# Patient Record
Sex: Male | Born: 1960 | Race: White | Hispanic: No | Marital: Married | Smoking: Never smoker
Health system: Southern US, Community
[De-identification: ages and names within clinical notes are randomized; demographics above are authoritative.]

## PROBLEM LIST (undated history)

## (undated) DIAGNOSIS — M25561 Pain in right knee: Secondary | ICD-10-CM

## (undated) DIAGNOSIS — Z9889 Other specified postprocedural states: Secondary | ICD-10-CM

## (undated) DIAGNOSIS — I480 Paroxysmal atrial fibrillation: Principal | ICD-10-CM

## (undated) DIAGNOSIS — E669 Obesity, unspecified: Secondary | ICD-10-CM

## (undated) DIAGNOSIS — L0390 Cellulitis, unspecified: Secondary | ICD-10-CM

## (undated) DIAGNOSIS — I10 Essential (primary) hypertension: Secondary | ICD-10-CM

## (undated) DIAGNOSIS — M25562 Pain in left knee: Secondary | ICD-10-CM

## (undated) HISTORY — DX: Obesity, unspecified: E66.9

## (undated) HISTORY — DX: Pain in left knee: M25.562

## (undated) HISTORY — DX: Paroxysmal atrial fibrillation: I48.0

## (undated) HISTORY — DX: Other specified postprocedural states: Z98.890

## (undated) HISTORY — DX: Essential (primary) hypertension: I10

## (undated) HISTORY — DX: Pain in right knee: M25.561

## (undated) HISTORY — DX: Cellulitis, unspecified: L03.90

---

## 2004-10-04 ENCOUNTER — Ambulatory Visit: Payer: Self-pay | Admitting: Professional

## 2004-10-11 ENCOUNTER — Ambulatory Visit: Payer: Self-pay | Admitting: Professional

## 2004-10-18 ENCOUNTER — Ambulatory Visit: Payer: Self-pay | Admitting: Professional

## 2005-08-12 ENCOUNTER — Emergency Department: Payer: Self-pay | Admitting: Unknown Physician Specialty

## 2007-05-06 DIAGNOSIS — I10 Essential (primary) hypertension: Secondary | ICD-10-CM | POA: Insufficient documentation

## 2007-05-16 ENCOUNTER — Ambulatory Visit: Payer: Self-pay | Admitting: Family Medicine

## 2007-05-16 DIAGNOSIS — E7800 Pure hypercholesterolemia, unspecified: Secondary | ICD-10-CM

## 2007-05-16 LAB — CONVERTED CEMR LAB
Alkaline Phosphatase: 63 units/L (ref 39–117)
BUN: 6 mg/dL (ref 6–23)
CO2: 29 meq/L (ref 19–32)
Calcium: 9.1 mg/dL (ref 8.4–10.5)
GFR calc Af Amer: 133 mL/min
GFR calc non Af Amer: 110 mL/min
LDL Cholesterol: 92 mg/dL (ref 0–99)
Potassium: 4.4 meq/L (ref 3.5–5.1)
Total CHOL/HDL Ratio: 4.6
Total Protein: 6.8 g/dL (ref 6.0–8.3)
Triglycerides: 82 mg/dL (ref 0–149)
VLDL: 16 mg/dL (ref 0–40)

## 2007-05-20 ENCOUNTER — Ambulatory Visit: Payer: Self-pay | Admitting: Family Medicine

## 2007-05-31 ENCOUNTER — Ambulatory Visit: Payer: Self-pay | Admitting: Cardiology

## 2007-06-16 ENCOUNTER — Emergency Department: Payer: Self-pay | Admitting: Emergency Medicine

## 2007-07-01 ENCOUNTER — Ambulatory Visit: Payer: Self-pay | Admitting: Family Medicine

## 2007-07-01 DIAGNOSIS — M25569 Pain in unspecified knee: Secondary | ICD-10-CM | POA: Insufficient documentation

## 2007-08-14 ENCOUNTER — Ambulatory Visit: Payer: Self-pay | Admitting: Family Medicine

## 2007-10-21 ENCOUNTER — Telehealth (INDEPENDENT_AMBULATORY_CARE_PROVIDER_SITE_OTHER): Payer: Self-pay | Admitting: *Deleted

## 2007-10-22 ENCOUNTER — Ambulatory Visit: Payer: Self-pay | Admitting: Family Medicine

## 2007-11-15 ENCOUNTER — Ambulatory Visit: Payer: Self-pay | Admitting: Family Medicine

## 2008-05-19 ENCOUNTER — Ambulatory Visit: Payer: Self-pay | Admitting: Family Medicine

## 2008-05-19 LAB — CONVERTED CEMR LAB
ALT: 17 units/L (ref 0–53)
AST: 15 units/L (ref 0–37)
Albumin: 3.6 g/dL (ref 3.5–5.2)
BUN: 16 mg/dL (ref 6–23)
CO2: 28 meq/L (ref 19–32)
Calcium: 9.1 mg/dL (ref 8.4–10.5)
Chloride: 108 meq/L (ref 96–112)
Cholesterol: 129 mg/dL (ref 0–200)
Creatinine, Ser: 1 mg/dL (ref 0.4–1.5)
GFR calc non Af Amer: 84 mL/min
HDL: 32.2 mg/dL — ABNORMAL LOW (ref >39.0)
LDL Cholesterol: 83 mg/dL (ref 0–99)
PSA: 1.39 ng/mL (ref 0.10–4.00)
Triglycerides: 71 mg/dL (ref 0–149)

## 2008-05-21 ENCOUNTER — Ambulatory Visit: Payer: Self-pay | Admitting: Family Medicine

## 2009-04-22 ENCOUNTER — Ambulatory Visit: Payer: Self-pay | Admitting: Family Medicine

## 2009-04-22 DIAGNOSIS — M109 Gout, unspecified: Secondary | ICD-10-CM

## 2009-05-25 ENCOUNTER — Encounter (INDEPENDENT_AMBULATORY_CARE_PROVIDER_SITE_OTHER): Payer: Self-pay | Admitting: *Deleted

## 2009-06-03 ENCOUNTER — Ambulatory Visit: Payer: Self-pay | Admitting: Family Medicine

## 2009-06-03 LAB — CONVERTED CEMR LAB
Albumin: 3.6 g/dL (ref 3.5–5.2)
Alkaline Phosphatase: 51 units/L (ref 39–117)
Basophils Relative: 0.3 % (ref 0.0–3.0)
CO2: 30 meq/L (ref 19–32)
Chloride: 108 meq/L (ref 96–112)
Eosinophils Absolute: 0.1 10*3/uL (ref 0.0–0.7)
HCT: 42.3 % (ref 39.0–52.0)
Hemoglobin: 13.9 g/dL (ref 13.0–17.0)
Lymphocytes Relative: 44.2 % (ref 12.0–46.0)
MCHC: 32.9 g/dL (ref 30.0–36.0)
MCV: 86.8 fL (ref 78.0–100.0)
Monocytes Absolute: 0.5 10*3/uL (ref 0.1–1.0)
Neutro Abs: 3.2 10*3/uL (ref 1.4–7.7)
PSA: 1.09 ng/mL (ref 0.10–4.00)
RBC: 4.87 M/uL (ref 4.22–5.81)
Sodium: 143 meq/L (ref 135–145)
Total CHOL/HDL Ratio: 4
Total Protein: 6.9 g/dL (ref 6.0–8.3)

## 2009-06-09 ENCOUNTER — Ambulatory Visit: Payer: Self-pay | Admitting: Family Medicine

## 2009-06-17 ENCOUNTER — Encounter (INDEPENDENT_AMBULATORY_CARE_PROVIDER_SITE_OTHER): Payer: Self-pay | Admitting: *Deleted

## 2009-06-17 ENCOUNTER — Ambulatory Visit: Payer: Self-pay | Admitting: Family Medicine

## 2009-09-08 IMAGING — CR DG KNEE COMPLETE 4+V*L*
1 series · 5 of 5 positions shown · non-contrast
Comparison: none

REASON FOR EXAM: Injury
COMMENTS:

PROCEDURE:     DXR - DXR KNEE LT COMP WITH OBLIQUES  - June 16, 2007  [DATE]
RESULT:     Images of the LEFT knee demonstrate no fracture, dislocation or
radiopaque foreign body. Degenerative joint space narrowing is noted in the
patellofemoral region.

[Series 1: view not recorded · 0.17mm/px · 5 of 5 slices shown]
[im 1/5]
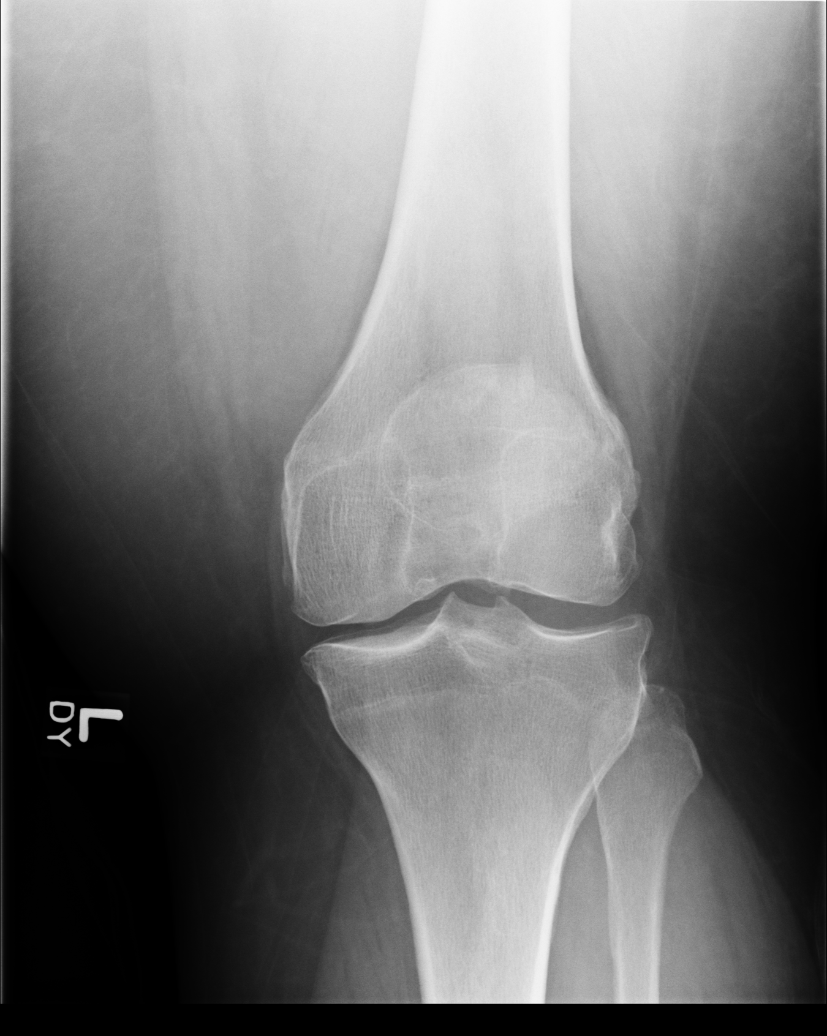
[im 2/5]
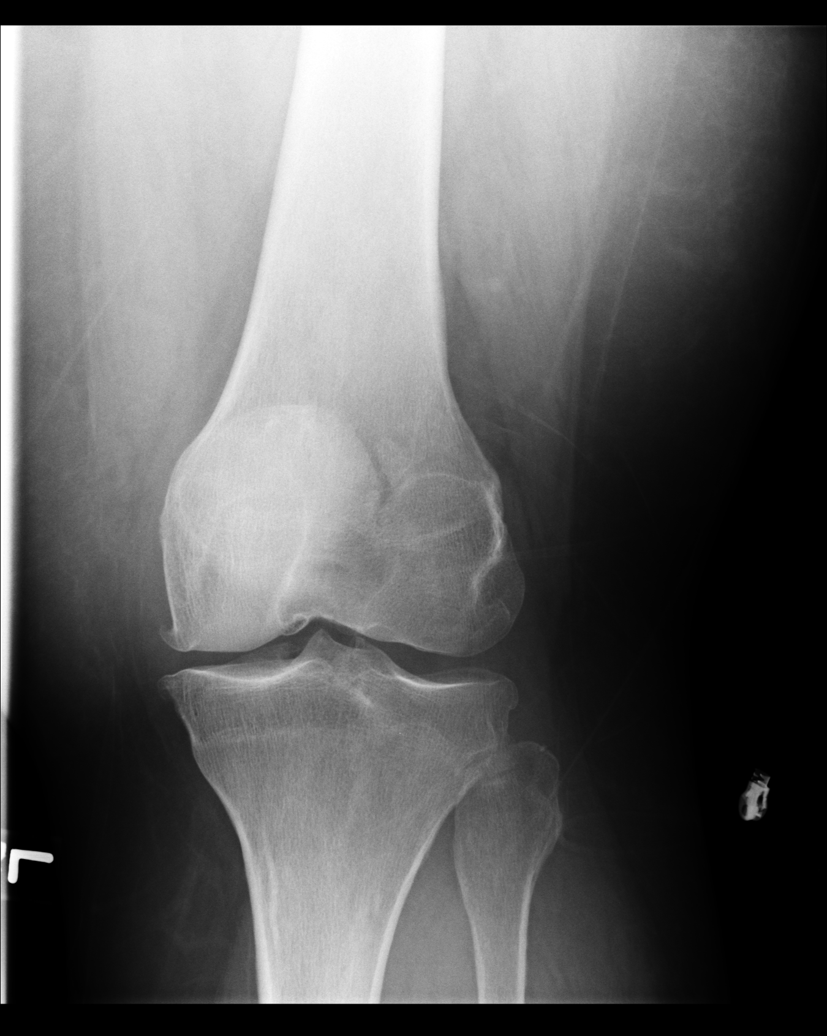
[im 3/5]
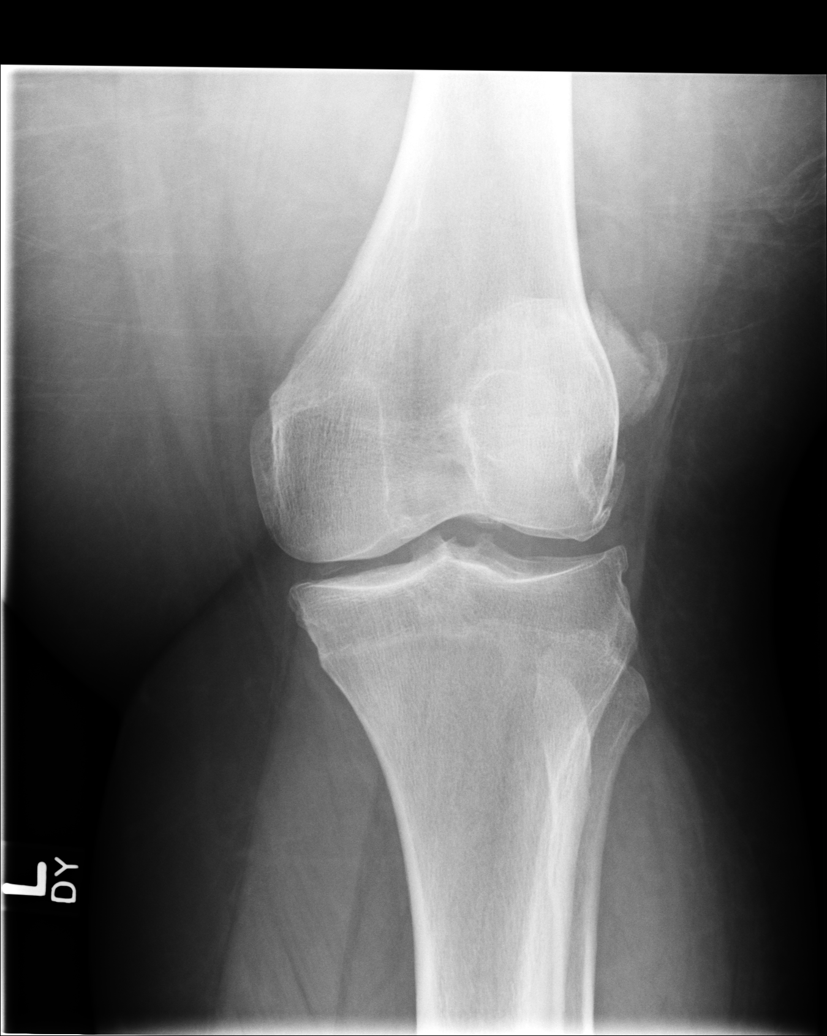
[im 4/5]
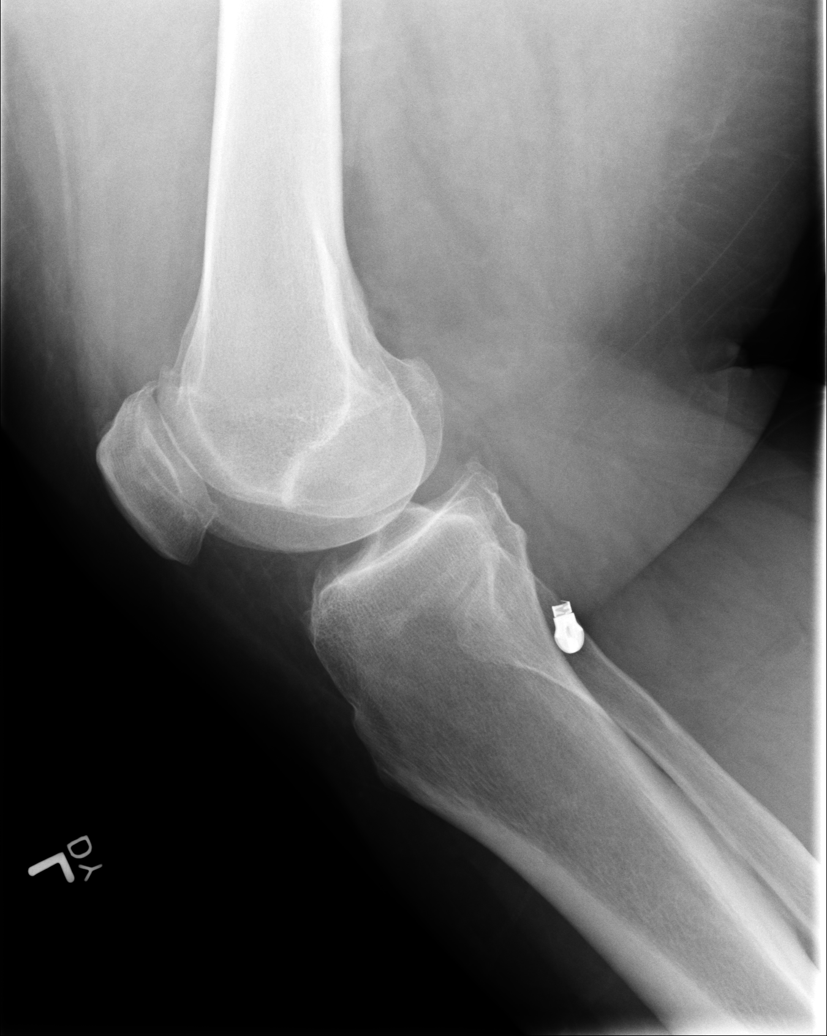
[im 5/5]
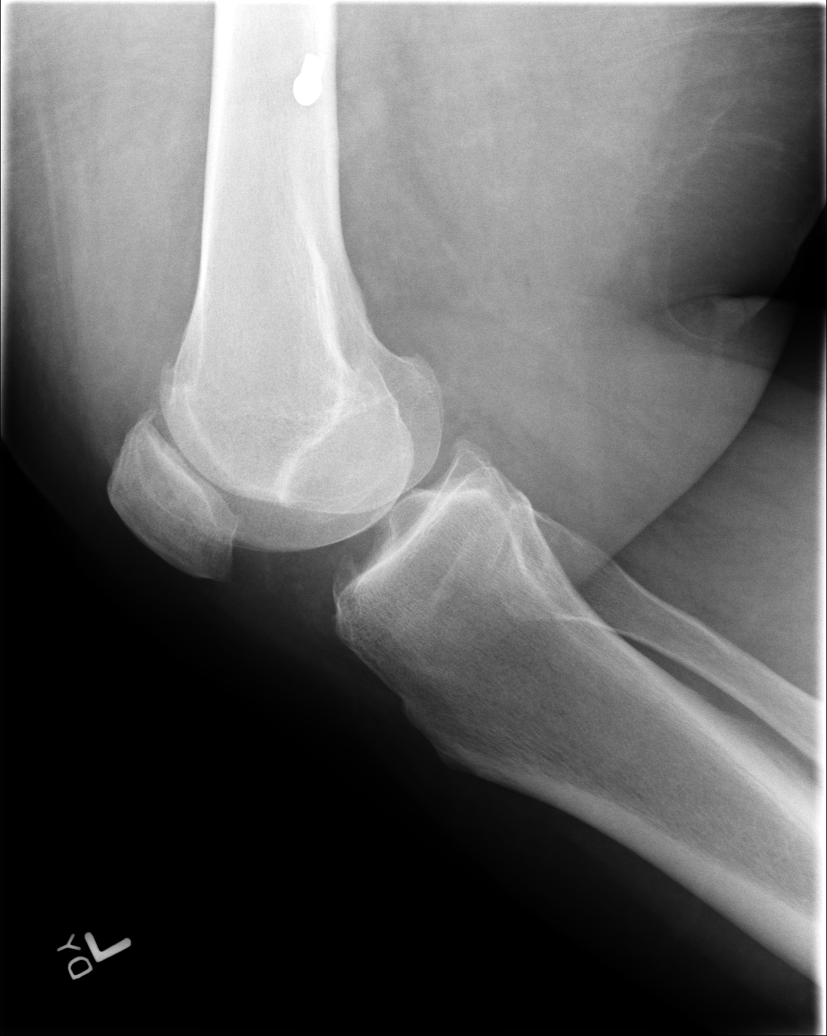

[5 of 5 positions shown; findings below may reference images not displayed]

IMPRESSION: Degenerative changes with joint space narrowing and
hypertrophic spurring. No acute bony abnormality is demonstrated.

## 2009-12-13 ENCOUNTER — Ambulatory Visit: Payer: Self-pay | Admitting: Family Medicine

## 2010-04-13 ENCOUNTER — Encounter (INDEPENDENT_AMBULATORY_CARE_PROVIDER_SITE_OTHER): Payer: Self-pay | Admitting: *Deleted

## 2010-10-11 NOTE — Letter (Signed)
Summary: Nadara Eaton letter  [place] [facility]  [address] [place], [place] [address]   Phone: [phone]  Fax: [phone]       04/13/2010 MRN: [id number]  [place] [address] [place], [place]  [address]  Dear Mr. Topor,  [facility] [place], and [place] announce the retirement of Arta Silence, M.D., from full-time practice at the [facility] office effective March 10, 2010 and his plans of returning part-time.  It is important to Dr. Hetty Ely and to our practice that you understand that [facility] has seven physicians in our office for your health care needs.  We will continue to offer the same exceptional care that you have today.    Dr. Hetty Ely has spoken to many of you about his plans for retirement and returning part-time in the fall.   We will continue to work with you through the transition to schedule appointments for you in the office and meet the high standards that [facility] is committed to.   Again, it is with great pleasure that we share the news that Dr. Hetty Ely will return to [facility] in October of 2011 with a reduced schedule.    If you have any questions, or would like to request an appointment with one of our physicians, please call us at [phone] and press the option for Scheduling an appointment.  We take pleasure in providing you with excellent patient care and look forward to seeing you at your next office visit.  Our [facility] Physicians are:  Tillman Abide, M.D. Laurita Quint, M.D. Roxy Manns, M.D. Kerby Nora, M.D. Hannah Beat, M.D. Ruthe Mannan, M.D. We proudly welcomed Raechel Ache, M.D. and Eustaquio Boyden, M.D. to the practice in July/August 2011.  Sincerely,  [facility]

## 2010-10-11 NOTE — Assessment & Plan Note (Signed)
Summary: 6 MONTH FOLLOW UP/[facility]   Vital Signs:  Patient profile:   50 year old male Weight:      423 pounds Temp:     98.7 degrees F oral Pulse rate:   60 / minute Pulse rhythm:   regular BP sitting:   128 / 84  (left arm) Cuff size:   large  Vitals Entered By: Sydell Axon LPN (December 14, 8411 9:54 AM) CC: 6 Month follow-up   History of Present Illness: Pt here for 6 month followup. He has gained 40+ pounds since last time. The holidays "got away from him" and he has had trouble with his ankles or feet enought that it has episodically bothered his workout routine.  He has no complaints. His right shoulder feels strained. He thought he had done something to his right shoulder lifting weights so backed off with no improvement. He declines meds or referral.  Problems Prior to Update: 1)  Special Screening Malig Neoplasms Other Sites  (ICD-V76.49) 2)  Gout  (ICD-274.9) 3)  Special Screening Malignant Neoplasm of Prostate  (ICD-V76.44) 4)  Knee Pain  (ICD-719.46) 5)  Health Maintenance Exam  (ICD-V70.0) 6)  Hypercholesterolemia, Pure  (ICD-272.0) 7)  Obesity  (ICD-278.00) 8)  Hypertension  (ICD-401.9)  Medications Prior to Update: 1)  Multivitamins   Tabs (Multiple Vitamin) .Marland Kitchen.. 1 Daily By Mouth 2)  Fish Oil Concentrate 1000 Mg  Caps (Omega-3 Fatty Acids) .Marland Kitchen.. 1 Tablet Twice A Day By Mouth 3)  Metoprolol Tartrate 50 Mg  Tabs (Metoprolol Tartrate) .... One Tab By Mouth Twice A Day 4)  Ibuprofen 200 Mg Tabs (Ibuprofen) .... Otc As Directed. 5)  Indocin 50mg  .... Take 1 Three Times A Day For 5d, Then 1 Two Times A Day For 5-7d, Then As Needed 6)  Nystatin 100000 Unit/gm Crea (Nystatin) .... Apply To Skin Two Times A Day  Allergies: No Known Drug Allergies  Physical Exam  General:  alert, well-developed, well-nourished, and well-hydrated.  morbidly obese, no acute distress. Head:  Normocephalic and atraumatic without obvious abnormalities. No apparent alopecia or balding. Sinuses  NT. Eyes:  Conjunctiva clear bilaterally.  Ears:  External ear exam shows no significant lesions or deformities.  Otoscopic examination reveals clear canals, tympanic membranes are intact bilaterally without bulging, retraction, inflammation or discharge. Hearing is grossly normal bilaterally. Nose:  External nasal examination shows no deformity or inflammation. Nasal mucosa are pink and moist without lesions or exudates. Mouth:  Oral mucosa and oropharynx without lesions or exudates.  Teeth in good repair. Neck:  No deformities, masses, or tenderness noted. Lungs:  Normal respiratory effort, chest expands symmetrically. Lungs are clear to auscultation, no crackles or wheezes. Heart:  Normal rate and regular rhythm. S1 and S2 normal without gallop, murmur, click, rub or other extra sounds.   Impression & Recommendations:  Problem # 1:  OBESITY (ICD-278.00) Assessment Deteriorated Declines nutrition referral. Infers he will start exercising regularly and try to watch his intake. He has reead [employer] in the past.  Problem # 2:  HYPERTENSION (ICD-401.9) Assessment: Unchanged Stable. Cont curr regimen. His updated medication list for this problem includes:    Metoprolol Tartrate 50 Mg Tabs (Metoprolol tartrate) ..... One tab by mouth twice a day  BP today: 128/84 Prior BP: 122/74 (06/09/2009)  Labs Reviewed: K+: 4.7 (06/03/2009) Creat: : 0.9 (06/03/2009)   Chol: 142 (06/03/2009)   HDL: 37.90 (06/03/2009)   LDL: 89 (06/03/2009)   TG: 75.0 (06/03/2009)  Complete Medication List: 1)  Multivitamins Tabs (Multiple vitamin) .Marland Kitchen.. 1 daily by mouth 2)  Fish Oil Concentrate 1000 Mg Caps (Omega-3 fatty acids) .Marland Kitchen.. 1 tablet twice a day by mouth 3)  Metoprolol Tartrate 50 Mg Tabs (Metoprolol tartrate) .... One tab by mouth twice a day 4)  Ibuprofen 200 Mg Tabs (Ibuprofen) .... Otc as directed. 5)  Indocin 50mg   .... Take 1 three times a day for 5d, then 1 two times a day for 5-7d, then as  needed 6)  Nystatin 100000 Unit/gm Crea (Nystatin) .... Apply to skin two times a day  Patient Instructions: 1)  RTC in the Fall for Comp Exam, labs prior. Prescriptions: METOPROLOL TARTRATE 50 MG  TABS (METOPROLOL TARTRATE) one tab by mouth twice a day  #60 x 12   Entered by:   Sydell Axon LPN   Authorized by:   Shaune Leeks MD   Signed by:   Sydell Axon LPN on 45/40/9811   Method used:   Print then Give to Patient   RxID:   [id number]   Current Allergies (reviewed today): No known allergies

## 2010-12-23 ENCOUNTER — Encounter: Payer: Self-pay | Admitting: Family Medicine

## 2011-01-17 ENCOUNTER — Ambulatory Visit (INDEPENDENT_AMBULATORY_CARE_PROVIDER_SITE_OTHER): Payer: 59 | Admitting: Family Medicine

## 2011-01-17 ENCOUNTER — Encounter: Payer: Self-pay | Admitting: Family Medicine

## 2011-01-17 DIAGNOSIS — R635 Abnormal weight gain: Secondary | ICD-10-CM

## 2011-01-17 DIAGNOSIS — I10 Essential (primary) hypertension: Secondary | ICD-10-CM

## 2011-01-17 MED ORDER — METOPROLOL TARTRATE 50 MG PO TABS: 50.0000 mg | ORAL_TABLET | Freq: Two times a day (BID) | ORAL | Status: DC

## 2011-01-17 NOTE — Progress Notes (Signed)
Hypertension:    Using medication without problems or lightheadedness: yes Chest pain with exertion:no Edema:occ Short of breath:with exertion, no change recently Other issues: d/w pt about weight, diet and exercise.  He is getting back into exercising  Meds, vitals, and allergies reviewed.   PMH and SH reviewed  ROS: See HPI.  Otherwise negative.    GEN: nad, alert and oriented, obese HEENT: mucous membranes moist NECK: supple w/o LA CV: rrr. PULM: ctab, no inc wob ABD: soft, +bs, exam limited by habitus EXT: trace edema SKIN: no acute rash

## 2011-01-17 NOTE — Assessment & Plan Note (Signed)
Check TSH at CPE this fall.  Continue to work on diet/exercise.

## 2011-01-17 NOTE — Assessment & Plan Note (Addendum)
Continue meds, work on diet and exercise.  Recheck in the fall.  If he isn't losing weight, we can set him up with nutrition.  He wanted to try on his own over the summer.  CPE this fall.

## 2011-01-17 NOTE — Patient Instructions (Signed)
I sent your medicine to the pharmacy.  Work on gradually increasing your exercise routine.   Schedule a physical for late September with labs ahead of time.  Take care.  Glad to see you today.

## 2011-01-24 NOTE — Assessment & Plan Note (Signed)
[facility] OFFICE NOTE   NAME:Benjamin Patterson                      MRN:          [id number]  DATE:05/30/2007                            DOB:          Jan 11, 1961    [name] is a delightful, 50 year old, married, white male with  multiple cardiac risk factors who Dr. Hetty Ely has asked to consult on  cardiovascular risk assessment and question need for stress test.   He is 50 years of age and has followed his weight for years.  He does  walk 1-2 miles 5-6 days a week.  He has some mild dyspnea but this has  not changed in a long time.  He feels this is from his weight.  Fortunately, he does not smoke.  He does not drink alcohol.  He is not  diabetic.  He has recently been diagnosed with hypertension and was just started on  metoprolol by Dr. Hetty Ely.  Dr. Hetty Ely lists hyperlipidemia but the patient says his cholesterol  is pretty good.  I suspect his triglycerides are high.  He is on fish  oil.  He is having no chest tightness, pressure, or symptoms of ischemia at  the present time.  He has no orthopnea, PND, or peripheral edema.   PAST MEDICAL HISTORY:  He has no known drug allergies, no dye allergies.   CURRENT MEDICATIONS:  1. Metoprolol XL 50 mg a day.  2. Fish oil 2,000 mg in the morning and 1,000 mg at noon.   SURGICAL HISTORY:  None.   FAMILY HISTORY:  His father had his 1st heart attack in his late 63s.   SOCIAL HISTORY:  He is a Advertising account planner.  He does a lot of computer  work.  He is married and has one 58 year old child.   REVIEW OF SYSTEMS:  Other than the HPI, he has a history of constipation  and reflux and some gout.  Otherwise, negative.   PHYSICAL EXAMINATION:  VITAL SIGNS:  His blood pressure is 150/90; his  pulse is 50 and regular; his weight is 405; he is 5 feet 9-3/4 inches.  HEENT:  Normocephalic atraumatic.  PERRLA.  Extraocular movements  intact.  Sclerae are  clear.  Facial symmetry is normal.  NECK:  Carotids  upstrokes are equal bilaterally without bruits.  There is no JVD.  Neck  is supple.  Thyroid is not enlarged.  LUNGS:  Clear.  HEART:  PMI could not be appreciated.  Normal S1 S2.  There is no  murmur.  ABDOMEN:  Obese.  Organomegaly cannot be assessed.  EXTREMITIES:  Reveal trace edema.  Pulses are intact.  NEUROLOGIC:  Intact.  SKIN:  Unremarkable.   His electrocardiogram is normal except for sinus bradycardia.   ASSESSMENT/PLAN:  I have had a long talk with [name].  I made it  clear to him that with his weight and his hypertension that he probably  has some atherosclerosis at this point.  Clinically he is not having any  major issues.  I do not think a stress test will really help Benjamin Patterson  at this  point in time.   I have strongly encouraged him to get his weight down and keep close  watch on his blood pressure.  Dr. Hetty Ely may have to add a second  drug.  Even though he is bradycardic, he is having no symptoms from  that.   If he begins to have increased dyspnea on exertion or any chest pressure  or discomfort with exertion, I have asked him to reconsult with Benjamin Patterson.  At  that point, we would consider a stress test and/or angiography.   He also realizes the risk of developing diabetes and obesity.  We spent  about 10 minutes talking about this as well.   We will see him back on a p.r.n. basis.   His weight may also preclude using a standard treadmill as a stress  test.     Maisie Fus C. Daleen Squibb, MD, [facility]  Electronically Signed    TCW/MedQ  DD: 05/31/2007  DT: 05/31/2007  Job #: [id number]   cc:   Arta Silence, MD

## 2011-02-28 ENCOUNTER — Encounter: Payer: Self-pay | Admitting: Cardiovascular Disease

## 2011-07-10 ENCOUNTER — Other Ambulatory Visit: Payer: 59

## 2011-07-17 ENCOUNTER — Encounter: Payer: 59 | Admitting: Family Medicine

## 2011-08-02 ENCOUNTER — Emergency Department (HOSPITAL_COMMUNITY): Payer: 59

## 2011-08-02 ENCOUNTER — Inpatient Hospital Stay (HOSPITAL_COMMUNITY)
Admission: EM | Admit: 2011-08-02 | Discharge: 2011-08-05 | DRG: 287 | Disposition: A | Discharge status: Home / Self Care | Payer: 59 | Attending: Cardiology | Admitting: Cardiology

## 2011-08-02 ENCOUNTER — Encounter (HOSPITAL_COMMUNITY): Payer: Self-pay | Admitting: Emergency Medicine

## 2011-08-02 ENCOUNTER — Other Ambulatory Visit: Payer: Self-pay

## 2011-08-02 DIAGNOSIS — I10 Essential (primary) hypertension: Secondary | ICD-10-CM | POA: Diagnosis present

## 2011-08-02 DIAGNOSIS — M25569 Pain in unspecified knee: Secondary | ICD-10-CM | POA: Diagnosis present

## 2011-08-02 DIAGNOSIS — E7800 Pure hypercholesterolemia, unspecified: Secondary | ICD-10-CM | POA: Insufficient documentation

## 2011-08-02 DIAGNOSIS — R079 Chest pain, unspecified: Secondary | ICD-10-CM

## 2011-08-02 DIAGNOSIS — Z6841 Body Mass Index (BMI) 40.0 and over, adult: Secondary | ICD-10-CM

## 2011-08-02 DIAGNOSIS — R7309 Other abnormal glucose: Secondary | ICD-10-CM | POA: Diagnosis not present

## 2011-08-02 DIAGNOSIS — I4891 Unspecified atrial fibrillation: Principal | ICD-10-CM

## 2011-08-02 DIAGNOSIS — E669 Obesity, unspecified: Secondary | ICD-10-CM | POA: Diagnosis present

## 2011-08-02 DIAGNOSIS — M109 Gout, unspecified: Secondary | ICD-10-CM | POA: Diagnosis present

## 2011-08-02 DIAGNOSIS — M1990 Unspecified osteoarthritis, unspecified site: Secondary | ICD-10-CM | POA: Diagnosis present

## 2011-08-02 LAB — CBC
MCHC: 33.3 g/dL (ref 30.0–36.0)
Platelets: 238 10*3/uL (ref 150–400)
RDW: 13.8 % (ref 11.5–15.5)
WBC: 9.3 10*3/uL (ref 4.0–10.5)

## 2011-08-02 LAB — POCT I-STAT TROPONIN I: Troponin i, poc: 0.01 ng/mL (ref 0.00–0.08)

## 2011-08-02 LAB — POCT I-STAT, CHEM 8
BUN: 8 mg/dL (ref 6–23)
Calcium, Ion: 1.13 mmol/L (ref 1.12–1.32)
Hemoglobin: 15.3 g/dL (ref 13.0–17.0)
Sodium: 144 mEq/L (ref 135–145)
TCO2: 25 mmol/L (ref 0–100)

## 2011-08-02 LAB — PROTIME-INR
INR: 1 (ref 0.00–1.49)
Prothrombin Time: 13.4 seconds (ref 11.6–15.2)

## 2011-08-02 MED ORDER — ONDANSETRON HCL 4 MG/2ML IJ SOLN: 4.0000 mg | Freq: Four times a day (QID) | INTRAMUSCULAR | Status: DC | PRN

## 2011-08-02 MED ORDER — ASPIRIN 325 MG PO TABS
325.0000 mg | ORAL_TABLET | Freq: Every day | ORAL | Status: DC
  Administered 2011-08-03: 325 mg via ORAL
  Filled 2011-08-02 (×2): qty 1

## 2011-08-02 MED ORDER — ASPIRIN 81 MG PO CHEW
324.0000 mg | CHEWABLE_TABLET | ORAL | Status: AC
  Administered 2011-08-02: 324 mg via ORAL

## 2011-08-02 MED ORDER — DILTIAZEM HCL 100 MG IV SOLR
5.0000 mg/h | INTRAVENOUS | Status: DC
  Administered 2011-08-03: 15 mg/h via INTRAVENOUS
  Filled 2011-08-02: qty 100

## 2011-08-02 MED ORDER — METOPROLOL TARTRATE 100 MG PO TABS
100.0000 mg | ORAL_TABLET | Freq: Two times a day (BID) | ORAL | Status: DC
  Administered 2011-08-03 – 2011-08-05 (×4): 100 mg via ORAL
  Filled 2011-08-02 (×9): qty 1

## 2011-08-02 MED ORDER — ASPIRIN 300 MG RE SUPP: 300.0000 mg | RECTAL | Status: DC

## 2011-08-02 MED ORDER — DILTIAZEM HCL 100 MG IV SOLR
5.0000 mg/h | INTRAVENOUS | Status: AC
  Administered 2011-08-02: 15 mg/h via INTRAVENOUS
  Filled 2011-08-02: qty 100

## 2011-08-02 MED ORDER — ASPIRIN EC 81 MG PO TBEC: 81.0000 mg | DELAYED_RELEASE_TABLET | Freq: Every day | ORAL | Status: DC

## 2011-08-02 MED ORDER — ACETAMINOPHEN 325 MG PO TABS: 650.0000 mg | ORAL_TABLET | ORAL | Status: DC | PRN

## 2011-08-02 MED ORDER — ASPIRIN 81 MG PO CHEW
CHEWABLE_TABLET | ORAL | Status: AC
  Administered 2011-08-02: 324 mg via ORAL
  Filled 2011-08-02: qty 4

## 2011-08-02 MED ORDER — NITROGLYCERIN 0.4 MG SL SUBL: 0.4000 mg | SUBLINGUAL_TABLET | SUBLINGUAL | Status: DC | PRN

## 2011-08-02 NOTE — ED Provider Notes (Signed)
History     CSN: [id number] Arrival date & time: 08/02/2011  7:11 PM   First MD Initiated Contact with Patient 08/02/11 1919      Chief Complaint  Patient presents with  . Tachycardia    (Consider location/radiation/quality/duration/timing/severity/associated sxs/prior treatment) Patient is a 50 y.o. male presenting with chest pain. The history is provided by the patient. No language interpreter was used.  Chest Pain The chest pain began 1 - 2 hours ago. Chest pain occurs constantly. The chest pain is unchanged. The pain is associated with exertion. At its most intense, the pain is at 9/10. The pain is currently at 9/10. The quality of the pain is described as dull. The pain does not radiate. Exacerbated by: none. Primary symptoms include shortness of breath and dizziness. Pertinent negatives for primary symptoms include no fever, no syncope, no cough, no wheezing and no altered mental status.  Dizziness also occurs with diaphoresis.  Associated symptoms include diaphoresis.  Pertinent negatives for associated symptoms include no lower extremity edema, no near-syncope, no orthopnea and no paroxysmal nocturnal dyspnea. He tried nothing for the symptoms. Risk factors include obesity and male gender.  Pertinent negatives for past medical history include no COPD, no CHF, no seizures and no TIA.  Pertinent negatives for family medical history include: family history of aortic dissection.  Procedure history is negative for cardiac catheterization, echocardiogram, persantine thallium, stress echo, stress thallium and exercise treadmill test.     Past Medical History  Diagnosis Date  . Hypertension   . Knee pain, bilateral   . Obesity     History reviewed. No pertinent past surgical history.  Family History  Problem Relation Age of Onset  . Heart disease Mother     Afib  . Heart disease Father     CABG X 3 (05), HTN, elevated cholesterol, TKR  . Hypertension Father   .  Hyperlipidemia Father   . Hypertension Sister   . Cancer Maternal Grandmother     Ovarian cancer  . Stroke Maternal Grandfather   . Diabetes Neg Hx   . Depression Neg Hx   . Alcohol abuse Neg Hx   . Drug abuse Neg Hx   . Prostate cancer Neg Hx   . Colon cancer Neg Hx     History  Substance Use Topics  . Smoking status: Never Smoker   . Smokeless tobacco: Not on file  . Alcohol Use: No      Review of Systems  Constitutional: Positive for diaphoresis. Negative for fever.  HENT: Negative for facial swelling and neck pain.   Eyes: Negative for discharge.  Respiratory: Positive for chest tightness and shortness of breath. Negative for cough, wheezing and stridor.   Cardiovascular: Positive for chest pain. Negative for orthopnea, syncope and near-syncope.  Genitourinary: Negative for difficulty urinating.  Musculoskeletal: Negative for arthralgias.  Neurological: Positive for dizziness. Negative for seizures and syncope.  Hematological: Negative.   Psychiatric/Behavioral: Negative.  Negative for altered mental status.    Allergies  Review of patient's allergies indicates no known allergies.  Home Medications   Current Outpatient Rx  Name Route Sig Dispense Refill  . OMEGA-3 FATTY ACIDS 1000 MG PO CAPS Oral Take 1 g by mouth 2 (two) times daily.      Marland Kitchen METOPROLOL TARTRATE 50 MG PO TABS Oral Take 1 tablet (50 mg total) by mouth 2 (two) times daily. 180 tablet 3  . ONE-DAILY MULTI VITAMINS PO TABS Oral Take 1 tablet by mouth daily.      Marland Kitchen  NAPROXEN PO Oral Take 3 tablets by mouth once.        BP 149/81  Pulse 152  Temp(Src) 97.6 F (36.4 C) (Oral)  Resp 18  Ht 5\' 10"  (1.778 m)  Wt 460 lb (208.655 kg)  BMI 66.00 kg/m2  SpO2 99%  Physical Exam  Nursing note and vitals reviewed. Constitutional: He is oriented to person, place, and time. He appears well-developed and well-nourished.  HENT:  Head: Normocephalic and atraumatic.  Mouth/Throat: Oropharynx is clear and  moist.  Eyes: Conjunctivae and EOM are normal. Pupils are equal, round, and reactive to light.  Neck: Normal range of motion. Neck supple.  Cardiovascular: An irregular rhythm present. Tachycardia present.  PMI is displaced.   Pulmonary/Chest: Effort normal and breath sounds normal. No respiratory distress.  Abdominal: Soft. Bowel sounds are normal. There is no tenderness. There is no rebound and no guarding.  Musculoskeletal: Normal range of motion.  Neurological: He is alert and oriented to person, place, and time.  Skin: Skin is warm and dry. He is not diaphoretic.  Psychiatric: Thought content normal.    ED Course  Procedures (including critical care time)  Labs Reviewed  POCT I-STAT, CHEM 8 - Abnormal; Notable for the following:    Glucose, Bld 113 (*)    All other components within normal limits  CBC  PROTIME-INR  I-STAT, CHEM 8  I-STAT TROPONIN I   No results found.   1. Atrial fibrillation with rapid ventricular response   2. Chest pain     Results for orders placed during the hospital encounter of 08/02/11  CBC      Component Value Range   WBC 9.3  4.0 - 10.5 (K/uL)   RBC 5.14  4.22 - 5.81 (MIL/uL)   Hemoglobin 14.4  13.0 - 17.0 (g/dL)   HCT 04.5  40.9 - 81.1 (%)   MCV 84.2  78.0 - 100.0 (fL)   MCH 28.0  26.0 - 34.0 (pg)   MCHC 33.3  30.0 - 36.0 (g/dL)   RDW 91.4  78.2 - 95.6 (%)   Platelets 238  150 - 400 (K/uL)  PROTIME-INR      Component Value Range   Prothrombin Time 13.4  11.6 - 15.2 (seconds)   INR 1.00  0.00 - 1.49   POCT I-STAT, CHEM 8      Component Value Range   Sodium 144  135 - 145 (mEq/L)   Potassium 3.7  3.5 - 5.1 (mEq/L)   Chloride 104  96 - 112 (mEq/L)   BUN 8  6 - 23 (mg/dL)   Creatinine, Ser 2.13  0.50 - 1.35 (mg/dL)   Glucose, Bld 086 (*) 70 - 99 (mg/dL)   Calcium, Ion 5.78  4.69 - 1.32 (mmol/L)   TCO2 25  0 - 100 (mmol/L)   Hemoglobin 15.3  13.0 - 17.0 (g/dL)   HCT 62.9  52.8 - 41.3 (%)  POCT I-STAT TROPONIN I      Component  Value Range   Troponin i, poc 0.01  0.00 - 0.08 (ng/mL)   Comment 3            No results found.   MDM   Date: 08/02/2011  Rate: 152  Rhythm: atrial fibrillation  QRS Axis: normal  Intervals: indeterminant  ST/T Wave abnormalities: nonspecific ST changes  Conduction Disutrbances:none  Narrative Interpretation:   Old EKG Reviewed: none available  MDM Interpretation: labs, ECG and x-ray Consults: cardiology        CRITICAL  CARE Performed by: Jasmine Awe   Total critical care time: 60 minutes  Critical care time was exclusive of separately billable procedures and treating other patients.  Critical care was necessary to treat or prevent imminent or life-threatening deterioration.  Critical care was time spent personally by me on the following activities: development of treatment plan with patient and/or surrogate as well as nursing, discussions with consultants, evaluation of patient's response to treatment, examination of patient, obtaining history from patient or surrogate, ordering and performing treatments and interventions, ordering and review of laboratory studies, ordering and review of radiographic studies, pulse oximetry and re-evaluation of patient's condition.  Jasmine Awe, MD 08/02/11 2134

## 2011-08-02 NOTE — ED Notes (Addendum)
Patient states that he was bending over pulling on a pan for 30 to 40 seconds. States that he stood up quickly and became lightheaded.  States that he quickly felt a heaviness in his chest.  States that EMS was called for chest heaviness that would not resolve. States that he was briefly short  Of breath.  EMS arrived and found patient to be in A-fib with RVR.  Patient given 5 mg of metoprolol IV per EMS.

## 2011-08-02 NOTE — H&P (Signed)
See dictated H&P # [id number]

## 2011-08-02 NOTE — H&P (Signed)
NAMEDEVAUN, HERNANDEZ               ACCOUNT NO.:  [id number]  MEDICAL RECORD NO.:  [id number]  LOCATION:  [id number]                        FACILITY:  [facility]  PHYSICIAN:  Natasha Bence, MD       DATE OF BIRTH:  [birth year]  DATE OF ADMISSION:  08/02/2011 DATE OF DISCHARGE:                             HISTORY & PHYSICAL   ATTENDING PHYSICIAN:  Dr. Patty Sermons.  PRIMARY CARE PHYSICIAN:  Dwana Curd. Para March, MD.  CHIEF COMPLAINT:  Chest discomfort.  HISTORY OF PRESENT ILLNESS:  Mr. Shere is a 50 year old white male with a history of hypertension, who presented to the emergency room night after feeling of chest heaviness.  He reports he was cooking in the kitchen and developed abrupt nonradiating chest tightness, lasted several minutes in duration.  He also became short of breath and was somewhat lightheaded and had a feeling of diaphoresis.  He bent over after about 5 minutes of discomfort to try to get some relief and the pain did subside somewhat, however, it reoccurred after he walked across the kitchen.  He did have associated shortness of breath with this.  He had significant pain until he arrived at the emergency room.  Upon arrival, he was found to be in atrial fibrillation with a rapid ventricular rate with a rate of about 150 beats per minute.  He still has some chest heaviness, although it is significantly improved.  His shortness of breath and diaphoresis are also gone.  He denies any chest discomfort like this before, however, he has had intermittent chest tightness, but usually while he is sitting around the house.  He has not had any episodes of palpitations or syncope in the past.  He does have baseline mild dyspnea on exertion which is unchanged over the last several months, however, he is not very ambulatory right now due to some chronic joint pain.  REVIEW OF SYSTEMS:  Otherwise, he has been doing well.  He denies any fevers, chills, or sweats.  She has had no recent weight  loss.  He initially lost about 100 pounds and then regained some of his weight back due to some chronic ankle and knee pain.  He denies any PND or orthopnea.  He says he sleeps on 1 pillow.  He has no history of sleep apnea.  His wife reports he does snore, however, he does not have any obvious apneic episodes.  He denies any recent bleeding.  He has no upcoming surgeries planned.  Rest of 12-point review of systems was performed and was otherwise negative except for as stated above.  PAST MEDICAL HISTORY: 1. Hypertension. 2. Gout. 3. Osteoarthritis.  PAST SURGICAL HISTORY:  None.  FAMILY HISTORY:  Father had coronary artery disease in his early 12s and had a bypass.  Mother has a history of atrial fibrillation.  SOCIAL HISTORY:  He is a nonsmoker and nondrinker.  He is married. Denies any illicit drug use.  MEDICATIONS:  He is on metoprolol 50 mg p.o. b.i.d. and he takes naproxen p.r.n.  ALLERGIES:  He has no known drug allergies.  PHYSICAL EXAMINATION:  VITAL SIGNS:  He is afebrile with a temperature of 97.6, heart rate is 152 and  irregular, respiratory rate is 18, blood pressure 149/81, O2 sats 99% on room air. GENERAL:  He is an obese white male, in no apparent distress. HEENT:  Eyes, he has anicteric sclerae.  Pupils equal, round, reactive to light. NEUROLOGIC:  Grossly focal.  He has symmetrical strength and sensation throughout.  Cranial nerves intact. HEENT:  Mucous membranes are moist. NECK:  He has normal jugular venous pressure.  No carotid bruits. LUNGS:  Clear to auscultation bilaterally. CARDIOVASCULAR:  He is irregularly irregular and tachycardic with a rate of approximately 150.  No murmurs, rubs, or gallops. ABDOMEN:  Obese, soft, nontender.  No masses or bruits palpated. EXTREMITIES:  Warm with no edema. SKIN:  No rashes or ulcers.  LABORATORY DATA:  Sodium 144, potassium 3.7, chloride of 104, BUN of 8, creatinine of 0.9, glucose of 113, ionized calcium  of 1.13.  White blood cell count was 9.3, hematocrit was 43.3, platelet count was 234.  PT was 13.4, INR was 1.0.  Troponin was 0.01.  Chest x-ray is pending.  EKG shows atrial fibrillation with a rate of 152 beats per minute.  He has posterior Q-wave, otherwise nonspecific ST changes.  IMPRESSION AND PLAN:  This is a 50 year old obese white male with hypertension, who presents with atrial fibrillation with rapid ventricular rate.  He has had associated chest discomfort, sounds worrisome for angina in this setting.  It is unclear when he went into atrial fibrillation as he is completely asymptomatic from a palpitation standpoint.  He has been initiated on diltiazem infusion.  We will go ahead and increase his metoprolol to 100 mg twice daily.  We will plan on getting an echocardiogram on him in the morning if he converts to sinus spontaneously.  If not, we will continue to work on rate control and discuss possibility of TEE cardioversion.  Given his chest Discomfort and  his risk factors, we will continue serial troponin checks. We will consider further coronary artery evaluation after we have results of these and his echocardiogram.  He has already received aspirin and we will continue this daily.  We will also place him on heparin infusion for both atrial fibrillation and unstable angina in addition to his beta-blocker.          ______________________________ Natasha Bence, MD     MH/MEDQ  D:  08/02/2011  T:  08/02/2011  Job:  [id number]

## 2011-08-03 ENCOUNTER — Other Ambulatory Visit: Payer: Self-pay

## 2011-08-03 DIAGNOSIS — I4891 Unspecified atrial fibrillation: Secondary | ICD-10-CM

## 2011-08-03 LAB — BASIC METABOLIC PANEL
Chloride: 106 mEq/L (ref 96–112)
GFR calc Af Amer: >90 mL/min (ref >90)
GFR calc non Af Amer: >90 mL/min (ref >90)
Glucose, Bld: 114 mg/dL — ABNORMAL HIGH (ref 70–99)
Potassium: 4.3 mEq/L (ref 3.5–5.1)
Sodium: 141 mEq/L (ref 135–145)

## 2011-08-03 LAB — CBC
Hemoglobin: 12.2 g/dL — ABNORMAL LOW (ref 13.0–17.0)
Platelets: 230 10*3/uL (ref 150–400)
RBC: 4.64 MIL/uL (ref 4.22–5.81)
WBC: 6.8 10*3/uL (ref 4.0–10.5)

## 2011-08-03 LAB — CARDIAC PANEL(CRET KIN+CKTOT+MB+TROPI)
CK, MB: 3.8 ng/mL (ref 0.3–4.0)
CK, MB: 3.2 ng/mL (ref 0.3–4.0)
CK, MB: 3.3 ng/mL (ref 0.3–4.0)
Relative Index: 2.5 (ref 0.0–2.5)
Troponin I: <0.3 ng/mL (ref <0.30)
Troponin I: <0.3 ng/mL (ref <0.30)

## 2011-08-03 LAB — LIPID PANEL
Cholesterol: 140 mg/dL (ref 0–200)
HDL: 41 mg/dL (ref >39)
Total CHOL/HDL Ratio: 3.4 RATIO

## 2011-08-03 LAB — HEPARIN LEVEL (UNFRACTIONATED): Heparin Unfractionated: 0.36 IU/mL (ref 0.30–0.70)

## 2011-08-03 MED ORDER — HEPARIN BOLUS VIA INFUSION
4000.0000 [IU] | Freq: Once | INTRAVENOUS | Status: AC
  Administered 2011-08-03: 4000 [IU] via INTRAVENOUS
  Filled 2011-08-03: qty 4000

## 2011-08-03 MED ORDER — HEPARIN BOLUS VIA INFUSION
3750.0000 [IU] | Freq: Once | INTRAVENOUS | Status: AC
  Administered 2011-08-03: 3750 [IU] via INTRAVENOUS
  Filled 2011-08-03: qty 3800

## 2011-08-03 MED ORDER — DILTIAZEM HCL 60 MG PO TABS
60.0000 mg | ORAL_TABLET | Freq: Four times a day (QID) | ORAL | Status: DC
  Administered 2011-08-03 – 2011-08-04 (×5): 60 mg via ORAL
  Filled 2011-08-03 (×10): qty 1

## 2011-08-03 MED ORDER — HEPARIN (PORCINE) IN NACL 100-0.45 UNIT/ML-% IJ SOLN
2500.0000 [IU]/h | INTRAMUSCULAR | Status: DC
  Administered 2011-08-03: 2250 [IU]/h via INTRAVENOUS
  Administered 2011-08-03 (×2): 2500 [IU]/h via INTRAVENOUS
  Administered 2011-08-03: 2250 [IU]/h via INTRAVENOUS
  Administered 2011-08-04: 2500 [IU]/h via INTRAVENOUS
  Filled 2011-08-03 (×6): qty 250

## 2011-08-03 MED ORDER — HEPARIN (PORCINE) IN NACL 100-0.45 UNIT/ML-% IJ SOLN
1850.0000 [IU]/h | INTRAMUSCULAR | Status: DC
  Administered 2011-08-03: 1850 [IU]/h via INTRAVENOUS
  Filled 2011-08-03: qty 250

## 2011-08-03 MED ORDER — OFF THE BEAT BOOK
Freq: Once | Status: AC
  Administered 2011-08-03: 06:00:00
  Filled 2011-08-03: qty 1

## 2011-08-03 MED ORDER — ALUM & MAG HYDROXIDE-SIMETH 200-200-20 MG/5ML PO SUSP
30.0000 mL | ORAL | Status: DC | PRN
  Administered 2011-08-03 – 2011-08-04 (×2): 30 mL via ORAL
  Filled 2011-08-03 (×2): qty 30

## 2011-08-03 NOTE — Progress Notes (Signed)
Subjective: Patient still with residual chest tightness. Objective: Filed Vitals:   08/03/11 0130 08/03/11 0252 08/03/11 0351 08/03/11 0625  BP: 113/77 130/67  97/68  Pulse:   58 61  Temp:    98.1 F (36.7 C)  TempSrc:    Oral  Resp:    20  Height:      Weight:      SpO2:    97%   Weight change:   Intake/Output Summary (Last 24 hours) at 08/03/11 0747 Last data filed at 08/02/11 2013  Gross per 24 hour  Intake      0 ml  Output    900 ml  Net   -900 ml    General: Alert, awake, oriented x3, mild chest tightness. HEENT: No obvious JVD> Heart: Regular rate and rhythm, without murmurs, rubs, gallops.  Lungs: Crackles left side, bilateral air movement.  Abdomen: Soft, nontender, obese Ext:  Tr edema Neuro: Grossly intact, nonfocal.   Lab Results: Results for orders placed during the hospital encounter of 08/02/11 (from the past 24 hour(s))  CBC     Status: Normal   Collection Time   08/02/11  7:30 PM      Component Value Range   WBC 9.3  4.0 - 10.5 (K/uL)   RBC 5.14  4.22 - 5.81 (MIL/uL)   Hemoglobin 14.4  13.0 - 17.0 (g/dL)   HCT 91.4  78.2 - 95.6 (%)   MCV 84.2  78.0 - 100.0 (fL)   MCH 28.0  26.0 - 34.0 (pg)   MCHC 33.3  30.0 - 36.0 (g/dL)   RDW 21.3  08.6 - 57.8 (%)   Platelets 238  150 - 400 (K/uL)  PROTIME-INR     Status: Normal   Collection Time   08/02/11  7:30 PM      Component Value Range   Prothrombin Time 13.4  11.6 - 15.2 (seconds)   INR 1.00  0.00 - 1.49   POCT I-STAT TROPONIN I     Status: Normal   Collection Time   08/02/11  7:52 PM      Component Value Range   Troponin i, poc 0.01  0.00 - 0.08 (ng/mL)   Comment 3           POCT I-STAT, CHEM 8     Status: Abnormal   Collection Time   08/02/11  7:54 PM      Component Value Range   Sodium 144  135 - 145 (mEq/L)   Potassium 3.7  3.5 - 5.1 (mEq/L)   Chloride 104  96 - 112 (mEq/L)   BUN 8  6 - 23 (mg/dL)   Creatinine, Ser 4.69  0.50 - 1.35 (mg/dL)   Glucose, Bld 629 (*) 70 - 99 (mg/dL)   Calcium, Ion 5.28  4.13 - 1.32 (mmol/L)   TCO2 25  0 - 100 (mmol/L)   Hemoglobin 15.3  13.0 - 17.0 (g/dL)   HCT 24.4  01.0 - 27.2 (%)  CARDIAC PANEL(CRET KIN+CKTOT+MB+TROPI)     Status: Normal   Collection Time   08/03/11  1:24 AM      Component Value Range   Total CK 150  7 - 232 (U/L)   CK, MB 3.8  0.3 - 4.0 (ng/mL)   Troponin I <0.30  <0.30 (ng/mL)   Relative Index 2.5  0.0 - 2.5   CARDIAC PANEL(CRET KIN+CKTOT+MB+TROPI)     Status: Normal (Preliminary result)   Collection Time   08/03/11  1:32 AM  Component Value Range   Total CK PENDING  7 - 232 (U/L)   CK, MB 3.2  0.3 - 4.0 (ng/mL)   Troponin I <0.30  <0.30 (ng/mL)   Relative Index PENDING  0.0 - 2.5   CARDIAC PANEL(CRET KIN+CKTOT+MB+TROPI)     Status: Normal (Preliminary result)   Collection Time   08/03/11  6:00 AM      Component Value Range   Total CK PENDING  7 - 232 (U/L)   CK, MB 3.3  0.3 - 4.0 (ng/mL)   Troponin I <0.30  <0.30 (ng/mL)   Relative Index PENDING  0.0 - 2.5   BASIC METABOLIC PANEL     Status: Abnormal   Collection Time   08/03/11  6:30 AM      Component Value Range   Sodium 141  135 - 145 (mEq/L)   Potassium 4.3  3.5 - 5.1 (mEq/L)   Chloride 106  96 - 112 (mEq/L)   CO2 26  19 - 32 (mEq/L)   Glucose, Bld 114 (*) 70 - 99 (mg/dL)   BUN 8  6 - 23 (mg/dL)   Creatinine, Ser 1.61  0.50 - 1.35 (mg/dL)   Calcium 9.0  8.4 - 09.6 (mg/dL)   GFR calc non Af Amer >90  >90 (mL/min)   GFR calc Af Amer >90  >90 (mL/min)  HEPARIN LEVEL (UNFRACTIONATED)     Status: Abnormal   Collection Time   08/03/11  6:30 AM      Component Value Range   Heparin Unfractionated 0.11 (*) 0.30 - 0.70 (IU/mL)  CBC     Status: Abnormal   Collection Time   08/03/11  6:30 AM      Component Value Range   WBC 6.8  4.0 - 10.5 (K/uL)   RBC 4.64  4.22 - 5.81 (MIL/uL)   Hemoglobin 12.2 (*) 13.0 - 17.0 (g/dL)   HCT 04.5  40.9 - 81.1 (%)   MCV 84.9  78.0 - 100.0 (fL)   MCH 26.3  26.0 - 34.0 (pg)   MCHC 31.0  30.0 - 36.0 (g/dL)    RDW 91.4  78.2 - 95.6 (%)   Platelets 230  150 - 400 (K/uL)  LIPID PANEL     Status: Normal   Collection Time   08/03/11  6:30 AM      Component Value Range   Cholesterol 140  0 - 200 (mg/dL)   Triglycerides 62  <213 (mg/dL)   HDL 41  >08 (mg/dL)   Total CHOL/HDL Ratio 3.4     VLDL 12  0 - 40 (mg/dL)   LDL Cholesterol 87  0 - 99 (mg/dL)    Studies/Results: Dg Chest Port 1 View  08/03/2011  *RADIOLOGY REPORT*  Clinical Data: Chest tightness, atrial fibrillation.  PORTABLE CHEST - 1 VIEW  Comparison: None.  Findings: Mild lung base opacities.  Cardiomegaly.  Central vascular congestion.  No pneumothorax.  No acute osseous abnormality.  IMPRESSION: Cardiomegaly with central vascular congestion.  Mild bibasilar opacities; atelectasis or mild edema.  Original Report Authenticated By: Waneta Martins, M.D.    Medications: I have reviewed the patient's current medications.   Patient Active Hospital Problem List: Atrial fibrillation with rapid ventricular response (08/02/2011)   Assessment: Converted to SR on own.  Still with mild chest tightness. This is probably the most concerning.  Patient without history of CAD  PResentation was fairly profound.   Initial marker negative. Will wait for others.   Plan: I would keep  on heparin for now esp given tightness.  Watch markers.  Continue tele.  Keep on higher dose of metoprolol.  Watch BP. With presentation I would favor inpatient evaluation of CP with myoview if neg markers or cath.  Close follow up.   HYPERCHOLESTEROLEMIA, PURE (05/16/2007)   Assessment:    Plan: Waitin on lab results. OBESITY (05/06/2007)   Assessment: Pt had lost about a hundred pounds in the past.  Felt better.  Now back up to 450.     Plan: Discussed need for wt loss.  Patient understands he needs to do something. HYPERTENSION (05/06/2007)   Assessment:  BP rel low.  Will follow response to meds .  Walk some later today.   Plan:   LOS: 1 day   Dietrich Pates 08/03/2011, 7:47 AM

## 2011-08-03 NOTE — Progress Notes (Signed)
ANTICOAGULATION CONSULT NOTE - Initial Consult  Pharmacy Consult for Heparin Indication: afib/USA  No Known Allergies  Patient Measurements: Height: 5\' 10"  (177.8 cm) Weight: 450 lb (204.119 kg) IBW/kg (Calculated) : 73  Adjusted Body Weight: 125 kg  Vital Signs: Temp: 97.9 F (36.6 C) (11/21 2257) Temp src: Oral (11/21 2257) BP: 99/62 mmHg (11/21 2257) Pulse Rate: 113  (11/21 2257)  Labs:  Basename 08/02/11 1954 08/02/11 1930  HGB 15.3 14.4  HCT 45.0 43.3  PLT -- 238  APTT -- --  LABPROT -- 13.4  INR -- 1.00  HEPARINUNFRC -- --  CREATININE 0.90 --  CKTOTAL -- --  CKMB -- --  TROPONINI -- --   Estimated Creatinine Clearance: 170.3 ml/min (by C-G formula based on Cr of 0.9).  Medical History: Past Medical History  Diagnosis Date  . Hypertension   . Knee pain, bilateral   . Obesity   . Gout     Medications:  Prescriptions prior to admission  Medication Sig Dispense Refill  . metoprolol (LOPRESSOR) 50 MG tablet Take 1 tablet (50 mg total) by mouth 2 (two) times daily.  180 tablet  3  . Multiple Vitamin (MULTIVITAMIN) tablet Take 1 tablet by mouth daily.        Marland Kitchen NAPROXEN PO Take 3 tablets by mouth once.        . fish oil-omega-3 fatty acids 1000 MG capsule Take 1 g by mouth 2 (two) times daily.          Assessment: [name] presents with chest discomfort/afib. To begin heparin gtt for USA/afib.   Goal of Therapy:  Heparin level 0.3-0.7 units/ml   Plan:  1. Heparin bolus 4000 units IV 2. Heparin gtt at 1850 units/hr 3. F/u 6 hr heparin level and CBC 4. Daily heparin level and CBC beginning 11/23  Lavonia Dana 08/03/2011,12:16 AM

## 2011-08-03 NOTE — Progress Notes (Signed)
ANTICOAGULATION CONSULT NOTE - Initial Consult  Pharmacy Consult for Heparin Indication: afib/USA  No Known Allergies  Patient Measurements: Height: 5\' 10"  (177.8 cm) Weight: 450 lb (204.119 kg) IBW/kg (Calculated) : 73  Adjusted Body Weight: 125 kg  Vital Signs: Temp: 98.1 F (36.7 C) (11/22 0625) Temp src: Oral (11/22 0625) BP: 142/92 mmHg (11/22 0923) Pulse Rate: 61  (11/22 0625)  Labs:  Basename 08/03/11 1515 08/03/11 0630 08/03/11 0600 08/03/11 0132 08/03/11 0124 08/02/11 1954 08/02/11 1930  HGB -- 12.2* -- -- -- 15.3 --  HCT -- 39.4 -- -- -- 45.0 43.3  PLT -- 230 -- -- -- -- 238  APTT -- -- -- -- -- -- --  LABPROT -- -- -- -- -- -- 13.4  INR -- -- -- -- -- -- 1.00  HEPARINUNFRC 0.27* 0.11* -- -- -- -- --  CREATININE -- 0.80 -- -- -- 0.90 --  CKTOTAL -- -- 129 129 150 -- --  CKMB -- -- 3.3 3.2 3.8 -- --  TROPONINI -- -- <0.30 <0.30 <0.30 -- --   Estimated Creatinine Clearance: 191.6 ml/min (by C-G formula based on Cr of 0.8).  Medical History: Past Medical History  Diagnosis Date  . Hypertension   . Knee pain, bilateral   . Obesity   . Gout     Medications:  Prescriptions prior to admission  Medication Sig Dispense Refill  . metoprolol (LOPRESSOR) 50 MG tablet Take 1 tablet (50 mg total) by mouth 2 (two) times daily.  180 tablet  3  . Multiple Vitamin (MULTIVITAMIN) tablet Take 1 tablet by mouth daily.        Marland Kitchen NAPROXEN PO Take 3 tablets by mouth once.        . fish oil-omega-3 fatty acids 1000 MG capsule Take 1 g by mouth 2 (two) times daily.          Assessment: [name] presents with chest discomfort/afib.  heparin gtt for USA/afib. Heparin level is subtherapeutic at 0.27<---0.11 this am. Will increase rate.  Goal of Therapy:  Heparin level 0.3-0.7 units/ml   Plan:  1. Increase Heparin gtt to 2250 units/hr 2. F/u 6 hr heparin level and CBC 3. Daily heparin level and CBC beginning 11/23  Benjamin Patterson, Benjamin Patterson 08/03/2011,4:10 PM

## 2011-08-03 NOTE — Progress Notes (Signed)
ANTICOAGULATION CONSULT NOTE - Initial Consult  Pharmacy Consult for Heparin Indication: afib/USA  No Known Allergies  Patient Measurements: Height: 5\' 10"  (177.8 cm) Weight: 450 lb (204.119 kg) IBW/kg (Calculated) : 73  Adjusted Body Weight: 125 kg  Vital Signs: Temp: 98.1 F (36.7 C) (11/22 0625) Temp src: Oral (11/22 0625) BP: 97/68 mmHg (11/22 0625) Pulse Rate: 61  (11/22 0625)  Labs:  Basename 08/03/11 0630 08/03/11 0600 08/03/11 0132 08/03/11 0124 08/02/11 1954 08/02/11 1930  HGB 12.2* -- -- -- 15.3 --  HCT 39.4 -- -- -- 45.0 43.3  PLT 230 -- -- -- -- 238  APTT -- -- -- -- -- --  LABPROT -- -- -- -- -- 13.4  INR -- -- -- -- -- 1.00  HEPARINUNFRC 0.11* -- -- -- -- --  CREATININE 0.80 -- -- -- 0.90 --  CKTOTAL -- 129 129 150 -- --  CKMB -- 3.3 3.2 3.8 -- --  TROPONINI -- <0.30 <0.30 <0.30 -- --   Estimated Creatinine Clearance: 191.6 ml/min (by C-G formula based on Cr of 0.8).  Medical History: Past Medical History  Diagnosis Date  . Hypertension   . Knee pain, bilateral   . Obesity   . Gout     Medications:  Prescriptions prior to admission  Medication Sig Dispense Refill  . metoprolol (LOPRESSOR) 50 MG tablet Take 1 tablet (50 mg total) by mouth 2 (two) times daily.  180 tablet  3  . Multiple Vitamin (MULTIVITAMIN) tablet Take 1 tablet by mouth daily.        Marland Kitchen NAPROXEN PO Take 3 tablets by mouth once.        . fish oil-omega-3 fatty acids 1000 MG capsule Take 1 g by mouth 2 (two) times daily.          Assessment: [name] presents with chest discomfort/afib.  heparin gtt for USA/afib. Heparin level is subtherapeutic. Will rebolus and increase rate  Goal of Therapy:  Heparin level 0.3-0.7 units/ml   Plan:  1. Heparin bolus 3750 units IV 2. Increase Heparin gtt to 2250 units/hr 3. F/u 6 hr heparin level and CBC 4. Daily heparin level and CBC beginning 11/23  Aydin Cavalieri, Cristal Deer 08/03/2011,8:48 AM

## 2011-08-03 NOTE — Progress Notes (Signed)
ANTICOAGULATION CONSULT NOTE - Initial Consult  Pharmacy Consult for Heparin Indication: afib/USA  No Known Allergies  Patient Measurements: Height: 5\' 10"  (177.8 cm) Weight: 450 lb (204.119 kg) IBW/kg (Calculated) : 73  Adjusted Body Weight: 125 kg  Vital Signs: Temp: 98.4 F (36.9 C) (11/22 2114) Temp src: Oral (11/22 2114) BP: 123/73 mmHg (11/22 2342) Pulse Rate: 63  (11/22 2114)  Labs:  Basename 08/03/11 2211 08/03/11 1515 08/03/11 0630 08/03/11 0600 08/03/11 0132 08/03/11 0124 08/02/11 1954 08/02/11 1930  HGB -- -- 12.2* -- -- -- 15.3 --  HCT -- -- 39.4 -- -- -- 45.0 43.3  PLT -- -- 230 -- -- -- -- 238  APTT -- -- -- -- -- -- -- --  LABPROT -- -- -- -- -- -- -- 13.4  INR -- -- -- -- -- -- -- 1.00  HEPARINUNFRC 0.36 0.27* 0.11* -- -- -- -- --  CREATININE -- -- 0.80 -- -- -- 0.90 --  CKTOTAL -- -- -- 129 129 150 -- --  CKMB -- -- -- 3.3 3.2 3.8 -- --  TROPONINI -- -- -- <0.30 <0.30 <0.30 -- --   Estimated Creatinine Clearance: 191.6 ml/min (by C-G formula based on Cr of 0.8).  Medical History: Past Medical History  Diagnosis Date  . Hypertension   . Knee pain, bilateral   . Obesity   . Gout     Medications:  Prescriptions prior to admission  Medication Sig Dispense Refill  . metoprolol (LOPRESSOR) 50 MG tablet Take 1 tablet (50 mg total) by mouth 2 (two) times daily.  180 tablet  3  . Multiple Vitamin (MULTIVITAMIN) tablet Take 1 tablet by mouth daily.        Marland Kitchen NAPROXEN PO Take 3 tablets by mouth once.        . fish oil-omega-3 fatty acids 1000 MG capsule Take 1 g by mouth 2 (two) times daily.          Assessment: 50yo male now therapeutic on heparin for Afib/USAP after rate increases.  Goal of Therapy:  Heparin level 0.3-0.7 units/ml   Plan:  Will confirm stable with am labs.  Colleen Can PharmD BCPS 08/03/2011,11:51 PM

## 2011-08-04 ENCOUNTER — Encounter (HOSPITAL_COMMUNITY): Payer: Self-pay | Admitting: Cardiology

## 2011-08-04 ENCOUNTER — Encounter (HOSPITAL_COMMUNITY)
Admission: EM | Disposition: A | Discharge status: Home / Self Care | Payer: Self-pay | Source: Home / Self Care | Attending: Cardiology

## 2011-08-04 DIAGNOSIS — I517 Cardiomegaly: Secondary | ICD-10-CM

## 2011-08-04 DIAGNOSIS — R079 Chest pain, unspecified: Secondary | ICD-10-CM

## 2011-08-04 HISTORY — PX: LEFT HEART CATHETERIZATION WITH CORONARY ANGIOGRAM: SHX5451

## 2011-08-04 LAB — CBC
Hemoglobin: 12.6 g/dL — ABNORMAL LOW (ref 13.0–17.0)
MCH: 27.2 pg (ref 26.0–34.0)
Platelets: 225 10*3/uL (ref 150–400)
RBC: 4.64 MIL/uL (ref 4.22–5.81)
WBC: 7 10*3/uL (ref 4.0–10.5)

## 2011-08-04 LAB — HEPARIN LEVEL (UNFRACTIONATED): Heparin Unfractionated: 0.31 IU/mL (ref 0.30–0.70)

## 2011-08-04 SURGERY — LEFT HEART CATHETERIZATION WITH CORONARY ANGIOGRAM
Anesthesia: LOCAL

## 2011-08-04 MED ORDER — NITROGLYCERIN 0.2 MG/ML ON CALL CATH LAB
INTRAVENOUS | Status: AC
  Filled 2011-08-04: qty 1

## 2011-08-04 MED ORDER — LIDOCAINE HCL (PF) 1 % IJ SOLN
INTRAMUSCULAR | Status: AC
  Filled 2011-08-04: qty 30

## 2011-08-04 MED ORDER — ASPIRIN 81 MG PO CHEW: 324.0000 mg | CHEWABLE_TABLET | ORAL | Status: DC

## 2011-08-04 MED ORDER — HEPARIN SODIUM (PORCINE) 1000 UNIT/ML IJ SOLN
INTRAMUSCULAR | Status: AC
  Filled 2011-08-04: qty 1

## 2011-08-04 MED ORDER — ASPIRIN 325 MG PO TABS
325.0000 mg | ORAL_TABLET | Freq: Every day | ORAL | Status: DC
  Administered 2011-08-05: 325 mg via ORAL
  Filled 2011-08-04: qty 1

## 2011-08-04 MED ORDER — SODIUM CHLORIDE 0.9 % IV SOLN
1.0000 mL/kg/h | INTRAVENOUS | Status: DC
  Administered 2011-08-04 (×2): 1 mL/kg/h via INTRAVENOUS

## 2011-08-04 MED ORDER — HEPARIN (PORCINE) IN NACL 100-0.45 UNIT/ML-% IJ SOLN
2800.0000 [IU]/h | INTRAMUSCULAR | Status: DC
  Administered 2011-08-05: 2500 [IU]/h via INTRAVENOUS
  Filled 2011-08-04 (×3): qty 250

## 2011-08-04 MED ORDER — MIDAZOLAM HCL 5 MG/5ML IJ SOLN
INTRAMUSCULAR | Status: AC
  Filled 2011-08-04: qty 5

## 2011-08-04 MED ORDER — SODIUM CHLORIDE 0.9 % IV SOLN
INTRAVENOUS | Status: AC
  Administered 2011-08-04: 17:00:00 via INTRAVENOUS

## 2011-08-04 MED ORDER — FENTANYL CITRATE 0.05 MG/ML IJ SOLN
INTRAMUSCULAR | Status: AC
  Filled 2011-08-04: qty 2

## 2011-08-04 MED ORDER — HEPARIN (PORCINE) IN NACL 2-0.9 UNIT/ML-% IJ SOLN
INTRAMUSCULAR | Status: AC
  Filled 2011-08-04: qty 2000

## 2011-08-04 MED ORDER — SODIUM CHLORIDE 0.9 % IJ SOLN: 3.0000 mL | Freq: Two times a day (BID) | INTRAMUSCULAR | Status: DC

## 2011-08-04 MED ORDER — VERAPAMIL HCL 2.5 MG/ML IV SOLN
INTRAVENOUS | Status: AC
  Filled 2011-08-04: qty 2

## 2011-08-04 MED ORDER — SODIUM CHLORIDE 0.9 % IJ SOLN: 3.0000 mL | INTRAMUSCULAR | Status: DC | PRN

## 2011-08-04 MED ORDER — SODIUM CHLORIDE 0.9 % IV SOLN: 250.0000 mL | INTRAVENOUS | Status: DC

## 2011-08-04 MED ORDER — ASPIRIN 81 MG PO CHEW
324.0000 mg | CHEWABLE_TABLET | ORAL | Status: AC
  Administered 2011-08-04: 324 mg via ORAL
  Filled 2011-08-04: qty 4

## 2011-08-04 MED ORDER — DIAZEPAM 5 MG PO TABS
5.0000 mg | ORAL_TABLET | ORAL | Status: AC
  Administered 2011-08-04: 5 mg via ORAL
  Filled 2011-08-04: qty 1

## 2011-08-04 NOTE — Progress Notes (Signed)
ANTICOAGULATION CONSULT NOTE - Initial Consult  Pharmacy Consult for Heparin Indication: afib/USA  No Known Allergies  Patient Measurements: Height: 5\' 10"  (177.8 cm) Weight: 450 lb (204.119 kg) IBW/kg (Calculated) : 73  Adjusted Body Weight: 125 kg  Vital Signs: Temp: 98 F (36.7 C) (11/23 0610) Temp src: Oral (11/23 0610) BP: 113/69 mmHg (11/23 0610) Pulse Rate: 58  (11/23 0610)  Labs:  Basename 08/04/11 0600 08/03/11 2211 08/03/11 1515 08/03/11 0630 08/03/11 0600 08/03/11 0132 08/03/11 0124 08/02/11 1954 08/02/11 1930  HGB 12.6* -- -- 12.2* -- -- -- -- --  HCT 39.6 -- -- 39.4 -- -- -- 45.0 --  PLT 225 -- -- 230 -- -- -- -- 238  APTT -- -- -- -- -- -- -- -- --  LABPROT -- -- -- -- -- -- -- -- 13.4  INR -- -- -- -- -- -- -- -- 1.00  HEPARINUNFRC 0.31 0.36 0.27* -- -- -- -- -- --  CREATININE -- -- -- 0.80 -- -- -- 0.90 --  CKTOTAL -- -- -- -- 129 129 150 -- --  CKMB -- -- -- -- 3.3 3.2 3.8 -- --  TROPONINI -- -- -- -- <0.30 <0.30 <0.30 -- --   Estimated Creatinine Clearance: 191.6 ml/min (by C-G formula based on Cr of 0.8).   Assessment: 50yo male now therapeutic on heparin for Afib/USAP and at goal.   Goal of Therapy:  Heparin level 0.3-0.7 units/ml   Plan:  No heparin changes needed, will follow post cath.   Benny Lennert PharmD BCPS 08/04/2011,8:19 AM

## 2011-08-04 NOTE — Op Note (Signed)
Cardiac Catheterization Procedure Note  Name: Benjamin Patterson MRN: [id number] DOB: 10/05/1960  Procedure: Left Heart Cath, Selective Coronary Angiography, LV angiography  Indication: Atrial fib with RVR, recurrent chest pain despite NSR.  Not a candidate for noninvasive testing.   Procedural Details: The right wrist was prepped, draped, and anesthetized with 1% lidocaine. Using the modified Seldinger technique, a 5 French sheath was introduced into the right radial artery. 3 mg of verapamil was administered through the sheath, weight-based unfractionated heparin was administered intravenously. Standard Judkins catheters were used for selective coronary angiography and left ventriculography. Catheter exchanges were performed over an exchange length guidewire. There were no immediate procedural complications. A TR band was used for radial hemostasis at the completion of the procedure.  The patient was transferred to the post catheterization recovery area for further monitoring.  Procedural Findings: Hemodynamics: AO [id number]) LV 140/24  Coronary angiography: Coronary dominance: right  Left mainstem: Large and no obstruction  Left anterior descending (LAD): Large with large diagonal.  No significant obstruction  Left circumflex (LCx): Supplies a large marginal with multiple smaller subbranches.  No sig obstruction  Right coronary artery (RCA): Large caliber, smooth, without obstruction  Left ventriculography: Left ventricular systolic function appeared well preserved.  Because of catheter induced ectopy, EF cannot be calculated, but post VPB beats appear normal without definite wall motion  Final Conclusions:  Chest pain with atrial fib, but with no significant coronary obstruction  Recommendations: Given the patient's history, one would question the possibility of OSA given his weight  (460lbs).  RX of AF will be the primary focus at this point.    Shawnie Pons 08/04/2011, 4:39 PM

## 2011-08-04 NOTE — Progress Notes (Addendum)
ANTICOAGULATION CONSULT NOTE - Follow Up Consult  Pharmacy Consult for heparin Indication: atrial fibrillation  No Known Allergies  Patient Measurements: Height: 5\' 10"  (177.8 cm) Weight: 450 lb (204.119 kg) IBW/kg (Calculated) : 73    Vital Signs: Temp: 97.9 F (36.6 C) (11/23 1431) Temp src: Oral (11/23 1431) BP: 118/78 mmHg (11/23 1431) Pulse Rate: 65  (11/23 1543)  Labs:  Basename 08/04/11 0600 08/03/11 2211 08/03/11 1515 08/03/11 0630 08/03/11 0600 08/03/11 0132 08/03/11 0124 08/02/11 1954 08/02/11 1930  HGB 12.6* -- -- 12.2* -- -- -- -- --  HCT 39.6 -- -- 39.4 -- -- -- 45.0 --  PLT 225 -- -- 230 -- -- -- -- 238  APTT -- -- -- -- -- -- -- -- --  LABPROT -- -- -- -- -- -- -- -- 13.4  INR -- -- -- -- -- -- -- -- 1.00  HEPARINUNFRC 0.31 0.36 0.27* -- -- -- -- -- --  CREATININE -- -- -- 0.80 -- -- -- 0.90 --  CKTOTAL -- -- -- -- 129 129 150 -- --  CKMB -- -- -- -- 3.3 3.2 3.8 -- --  TROPONINI -- -- -- -- <0.30 <0.30 <0.30 -- --   Estimated Creatinine Clearance: 191.6 ml/min (by C-G formula based on Cr of 0.8).   Medications:  Scheduled:    . aspirin  324 mg Oral Pre-Cath  . aspirin  325 mg Oral Daily  . diazepam  5 mg Oral On Call  . fentaNYL      . heparin      . heparin      . lidocaine      . metoprolol tartrate  100 mg Oral BID  . midazolam      . nitroGLYCERIN      . verapamil      . DISCONTD: aspirin  324 mg Oral Pre-Cath  . DISCONTD: aspirin  325 mg Oral Daily  . DISCONTD: diltiazem  60 mg Oral Q6H  . DISCONTD: sodium chloride  3 mL Intravenous Q12H    Assessment: 50 yo M with afib s/p cath today, no significant coronary obstruction found.  Spoke w/ Theodore Demark, PA who asked pharmacy to resume heparin 6 hours after the sheath was pulled.  Goal of Therapy:  Heparin level 0.3-0.7 units/ml   Plan:  1.  Resume heparin at 2200 (6h after sheath pulled at 16080 at previous therapeutic rate of 2500 units/hr  2.  Check 6h heparin level  Kaena Santori L.  Illene Bolus, PharmD, BCPS Clinical Pharmacist Pager: [phone] 08/04/2011 6:18 PM

## 2011-08-04 NOTE — Progress Notes (Signed)
  Echocardiogram 2D Echocardiogram has been performed.  Earle Burson, Real Cons 08/04/2011, 11:49 AM

## 2011-08-04 NOTE — Progress Notes (Signed)
Subjective:  He has ruled out for MI. He has remained in NSR since converting yesterday am. He continues to have intermittent left chest pain.  EKGs negative.  Objective:  Vital Signs in the last 24 hours: Temp:  [98 F (36.7 C)-98.4 F (36.9 C)] 98 F (36.7 C) (11/23 0610) Pulse Rate:  [58-63] 58  (11/23 0610) Resp:  [20] 20  (11/23 0610) BP: (113-143)/(69-92) 113/69 mmHg (11/23 0610) SpO2:  [93 %-96 %] 93 % (11/23 0610)  Intake/Output from previous day: 11/22 0701 - 11/23 0700 In: 120 [P.O.:120] Out: -  Intake/Output from this shift:       . aspirin  325 mg Oral Daily  . diltiazem  60 mg Oral Q6H  . heparin  3,750 Units Intravenous Once  . metoprolol tartrate  100 mg Oral BID      . heparin 2,500 Units/hr (08/03/11 2127)  . DISCONTD: diltiazem (CARDIZEM) infusion 15 mg/hr (08/03/11 0106)  . DISCONTD: heparin 1,850 Units/hr (08/03/11 0106)    Physical Exam: The patient appears to be in no distress.  Morbidly obese.  Head and neck exam reveals that the pupils are equal and reactive.  The extraocular movements are full.  There is no scleral icterus.  Mouth and pharynx are benign.  No lymphadenopathy.  No carotid bruits.  The jugular venous pressure is normal.  Thyroid is not enlarged or tender.  Chest is clear to percussion and auscultation.  No rales or rhonchi.  Expansion of the chest is symmetrical.  Heart reveals no abnormal lift or heave.  First and second heart sounds are normal.  There is no murmur gallop rub or click.  The abdomen is soft and nontender.  Bowel sounds are normoactive.  There is no hepatosplenomegaly or mass.  There are no abdominal bruits.  Extremities reveal no phlebitis or edema.  Pedal pulses are good.  There is no cyanosis or clubbing.  Strong right radial pulse  Neurologic exam is normal strength and no lateralizing weakness.  No sensory deficits.  Integument reveals no rash  Lab Results:  Basename 08/04/11 0600 08/03/11 0630  WBC 7.0  6.8  HGB 12.6* 12.2*  PLT 225 230    Basename 08/03/11 0630 08/02/11 1954  NA 141 144  K 4.3 3.7  CL 106 104  CO2 26 --  GLUCOSE 114* 113*  BUN 8 8  CREATININE 0.80 0.90    Basename 08/03/11 0600 08/03/11 0132  TROPONINI <0.30 <0.30   Hepatic Function Panel No results found for this basename: PROT,ALBUMIN,AST,ALT,ALKPHOS,BILITOT,BILIDIR,IBILI in the last 72 hours  Basename 08/03/11 0630  CHOL 140   No results found for this basename: PROTIME in the last 72 hours  Imaging: Imaging results have been reviewed.  Cardiomegaly on portable film  Cardiac Studies:  Assessment/Plan:  Patient Active Problem List  Diagnoses  . Chest pain with multiple risk factors      Plan: Cardiac cath today.  With his large body habitus nuclear would have to be 2 day protocol and still might be inconclusive images.  Atrial fibrillation.      Remaining in NSR. Will continue heparin until cath then plan to send home on ASA.  Stop diltiazem now and continue metoprolol.  .   .   .   .   .   .     LOS: 2 days    Benjamin Patterson 08/04/2011, 7:38 AM

## 2011-08-04 NOTE — Progress Notes (Signed)
TR BAND REMOVAL  LOCATION:  right radial  DEFLATED PER PROTOCOL:  yes  TIME BAND OFF / DRESSING APPLIED:   1850   SITE UPON ARRIVAL:   Level 0  SITE AFTER BAND REMOVAL:  Level 0  REVERSE ALLEN'S TEST:    positive  CIRCULATION SENSATION AND MOVEMENT:  Within Normal Limits  yes  COMMENTS:     

## 2011-08-04 NOTE — Consult Note (Signed)
  I reviewed the patients history and evaluated his presentation.  He converted to NSR, but has had some continued CP.  We plan approach from the RRA.  The femorals are not practical at 460lbs.  His pannus is large.  I explained the procedure and the risks to the patient, and I have spoken to his wife.  Labs have been reviewed.  Patient agreeable to proceed.  Shawnie Pons 08/04/2011 3:28 PM

## 2011-08-05 LAB — CBC
MCH: 27.7 pg (ref 26.0–34.0)
MCHC: 32.2 g/dL (ref 30.0–36.0)
MCV: 86 fL (ref 78.0–100.0)
Platelets: 211 10*3/uL (ref 150–400)
RDW: 14.1 % (ref 11.5–15.5)
WBC: 7.2 10*3/uL (ref 4.0–10.5)

## 2011-08-05 MED ORDER — NAPROXEN 500 MG PO TABS: 500.0000 mg | ORAL_TABLET | Freq: Every day | ORAL | Status: DC | PRN

## 2011-08-05 MED ORDER — METOPROLOL TARTRATE 100 MG PO TABS: 100.0000 mg | ORAL_TABLET | Freq: Two times a day (BID) | ORAL | Status: DC

## 2011-08-05 MED ORDER — ASPIRIN 325 MG PO TABS: 325.0000 mg | ORAL_TABLET | Freq: Every day | ORAL | Status: DC

## 2011-08-05 NOTE — Progress Notes (Signed)
ANTICOAGULATION CONSULT NOTE - Follow Up Consult  Pharmacy Consult for heparin Indication: atrial fibrillation  No Known Allergies  Patient Measurements: Height: 5\' 10"  (177.8 cm) Weight: 466 lb 11.4 oz (211.7 kg) IBW/kg (Calculated) : 73  Adjusted wt 127.4 kg  Vital Signs: Temp: 98.1 F (36.7 C) (11/24 0359) Temp src: Oral (11/24 0359) BP: 127/71 mmHg (11/24 0359) Pulse Rate: 84  (11/24 0359)  Labs:  Basename 08/05/11 0530 08/04/11 0600 08/03/11 2211 08/03/11 0630 08/03/11 0600 08/03/11 0132 08/03/11 0124 08/02/11 1954 08/02/11 1930  HGB 12.5* 12.6* -- -- -- -- -- -- --  HCT 38.8* 39.6 -- 39.4 -- -- -- -- --  PLT 211 225 -- 230 -- -- -- -- --  APTT -- -- -- -- -- -- -- -- --  LABPROT -- -- -- -- -- -- -- -- 13.4  INR -- -- -- -- -- -- -- -- 1.00  HEPARINUNFRC 0.17* 0.31 0.36 -- -- -- -- -- --  CREATININE -- -- -- 0.80 -- -- -- 0.90 --  CKTOTAL -- -- -- -- 129 129 150 -- --  CKMB -- -- -- -- 3.3 3.2 3.8 -- --  TROPONINI -- -- -- -- <0.30 <0.30 <0.30 -- --   Estimated Creatinine Clearance: 196.3 ml/min (by C-G formula based on Cr of 0.8).  Medications:  Scheduled:    . aspirin  324 mg Oral Pre-Cath  . aspirin  325 mg Oral Daily  . diazepam  5 mg Oral On Call  . fentaNYL      . heparin      . heparin      . lidocaine      . metoprolol tartrate  100 mg Oral BID  . midazolam      . nitroGLYCERIN      . verapamil      . DISCONTD: aspirin  324 mg Oral Pre-Cath  . DISCONTD: aspirin  325 mg Oral Daily  . DISCONTD: diltiazem  60 mg Oral Q6H  . DISCONTD: sodium chloride  3 mL Intravenous Q12H    Assessment: 50yo male subtherapeutic on heparin for Afib after resumed post-cath; level may continue to increase but had been at lower end of goal prior to being held so there is room to move.  Goal of Therapy:  Heparin level 0.3-0.7 units/ml   Plan:  Will increase gtt ~2 units/kg/hr to 2800 units/hr and check level in 6hr.  Vernard Gambles, PharmD, BCPS  08/05/2011 7:35  AM

## 2011-08-05 NOTE — Progress Notes (Signed)
Subjective:  Patient has had no further atrial fib.  Cardiac cath yesterday showed no significant disease.  2D echo showed mild LVH and normal LV systolic function EF 55-60 %.  No further chest discomfort. Objective:  Vital Signs in the last 24 hours: Temp:  [97.8 F (36.6 C)-98.7 F (37.1 C)] 97.8 F (36.6 C) (11/24 0700) Pulse Rate:  [62-87] 68  (11/24 0700) Resp:  [18-19] 18  (11/24 0700) BP: (118-142)/(66-85) 142/82 mmHg (11/24 0700) SpO2:  [95 %-99 %] 99 % (11/24 0700) Weight:  [466 lb 11.4 oz (211.7 kg)] 466 lb 11.4 oz (211.7 kg) (11/24 0625)  Intake/Output from previous day: 11/23 0701 - 11/24 0700 In: 867.5 [P.O.:480; I.V.:387.5] Out: -  Intake/Output from this shift:       . aspirin  324 mg Oral Pre-Cath  . aspirin  325 mg Oral Daily  . diazepam  5 mg Oral On Call  . fentaNYL      . heparin      . heparin      . lidocaine      . metoprolol tartrate  100 mg Oral BID  . midazolam      . nitroGLYCERIN      . verapamil      . DISCONTD: aspirin  324 mg Oral Pre-Cath  . DISCONTD: aspirin  325 mg Oral Daily  . DISCONTD: diltiazem  60 mg Oral Q6H  . DISCONTD: sodium chloride  3 mL Intravenous Q12H      . sodium chloride 150 mL/hr at 08/04/11 1705  . heparin 2,500 Units/hr (08/05/11 0011)  . DISCONTD: sodium chloride    . DISCONTD: sodium chloride 1 mL/kg/hr (08/04/11 1326)  . DISCONTD: heparin 2,500 Units/hr (08/04/11 0813)    Physical Exam: The patient appears to be in no distress.  Head and neck exam reveals that the pupils are equal and reactive.  The extraocular movements are full.  There is no scleral icterus.  Mouth and pharynx are benign.  No lymphadenopathy.  No carotid bruits.  The jugular venous pressure is normal.  Thyroid is not enlarged or tender.  Chest is clear to percussion and auscultation.  No rales or rhonchi.  Expansion of the chest is symmetrical.  Heart reveals no abnormal lift or heave.  First and second heart sounds are normal.  There is  no murmur gallop rub or click.  The abdomen is soft and nontender.  Bowel sounds are normoactive.  There is no hepatosplenomegaly or mass.  There are no abdominal bruits.  Extremities reveal no phlebitis or edema.  Pedal pulses are good.  There is no cyanosis or clubbing.Right radial pulse good. Neurologic exam is normal strength and no lateralizing weakness.  No sensory deficits.  Integument reveals no rash  Lab Results:  Basename 08/05/11 0530 08/04/11 0600  WBC 7.2 7.0  HGB 12.5* 12.6*  PLT 211 225    Basename 08/03/11 0630 08/02/11 1954  NA 141 144  K 4.3 3.7  CL 106 104  CO2 26 --  GLUCOSE 114* 113*  BUN 8 8  CREATININE 0.80 0.90    Basename 08/03/11 0600 08/03/11 0132  TROPONINI <0.30 <0.30   Hepatic Function Panel No results found for this basename: PROT,ALBUMIN,AST,ALT,ALKPHOS,BILITOT,BILIDIR,IBILI in the last 72 hours  Basename 08/03/11 0630  CHOL 140   No results found for this basename: PROTIME in the last 72 hours  Imaging: Imaging results have been reviewed  Cardiac Studies:  Assessment/Plan:  Patient Active Problem List  Diagnoses  .  HYPERCHOLESTEROLEMIA, PURE  . GOUT  . OBESITY  . HYPERTENSION  . KNEE PAIN  . Weight gain, abnormal  . Atrial fibrillation with rapid ventricular response   Plan:  Okay for discharge today. To go home on ASA 325 one daily. CHADSS score 1, for BP.  To go home on Metoprolol tartrate 100 mg BID.Medical followup with his PCP Dr. Para March with [facility].  Consider outpatient w/u for sleep apnea.  Needs aggressive mangaement of his obesity.  Follow-up with Dr. Patty Sermons or Lawson Fiscal in several weeks.   Benjamin Patterson 08/05/2011, 7:39 AM

## 2011-08-05 NOTE — Discharge Summary (Signed)
Physician Discharge Summary  Patient ID: Benjamin Patterson,  MRN: [id number], DOB/AGE: 02-06-1961 50 y.o.  Admit date: 08/02/2011 Discharge date: 08/05/2011  Primary Discharge Diagnosis:  1. Newly diagnosed atrial fibrillation with RVR 2. Chest pain with negative cardiac enzymes and cath 08/04/11 demonstrating no significant obstruction 3. Hyperglycemia with A1C of 5.8 4. Obesity (instructed to f/u PCP for possible sleep study)  Secondary Discharge Diagnosis:  1. HTN 2. HL 3. Gout 4. Osteoarthritis  Hospital Course: 50 y/o M with a history of HTN who presented to the ER with complaints of chest heaviness that occurred with abrumpt onset while cooking, with associated SOB and diaphoresis. He presented to the ER where he was found to be in afib with RVR with rates 150's. He had not had any prior episodes of palpitation or any episodes of syncope. He noted chronic exertional DOE. At time of eval, he was still going fast so was initiated on a diltiazem gtt and his BB was increased. The following day, his heart rate was down into the 50's - even with conversion to NSR, he had intermittent left chest pain. 2D echo was obtained showing EF 55-60% with mild LVH. Cath was recommended in light of cardiac risk factors (not ideal candidate for nuc given habitus), and this demonstrated no significant CAD. Dr. Riley Kill wondered if he might have sleep apnea. The patient was observed overnight and had no further pain or atrial fibrillation. Dr. Patty Sermons has seen and examined him today and feels he is stable for discharge. He will continue ASA 325mg  at discharge given a CHADS2 of 1 per Dr. Patty Sermons, as well as his higher dose of metoprolol.  Discharge Vitals: Blood pressure 142/82, pulse 68, temperature 97.8 F (36.6 C), temperature source Oral, resp. rate 18, height 5\' 10"  (1.778 m), weight 466 lb 11.4 oz (211.7 kg), SpO2 99.00%.  Labs: Lab Results  Component Value Date   WBC 7.2 08/05/2011   HGB 12.5*  08/05/2011   HCT 38.8* 08/05/2011   MCV 86.0 08/05/2011   PLT 211 08/05/2011    Lab 08/03/11 0630  NA 141  K 4.3  CL 106  CO2 26  BUN 8  CREATININE 0.80  CALCIUM 9.0  PROT --  BILITOT --  ALKPHOS --  ALT --  AST --  GLUCOSE 114*    Basename 08/03/11 0600 08/03/11 0132 08/03/11 0124  CKTOTAL 129 129 150  CKMB 3.3 3.2 3.8  TROPONINI <0.30 <0.30 <0.30   Lab Results  Component Value Date   CHOL 140 08/03/2011   HDL 41 08/03/2011   LDLCALC 87 08/03/2011   TRIG 62 08/03/2011   No results found for this basename: DDIMER    Diagnostic Studies/Procedures:  1. 08/03/2011- PORTABLE CHEST - 1 VIEW    IMPRESSION: Cardiomegaly with central vascular congestion.  Mild bibasilar opacities; atelectasis or mild edema.  2. Cardiac cath 08/04/11, see full report for details 3. 2D echo 08/04/11, - Left ventricle: LVEF is grossly normal at 55 to 60%. Cannot evaluate regional wall motion as endocardium is not well seen. The cavity size was normal. Wall thickness was increased in a pattern of mild LVH. - Pulmonary arteries: PA peak pressure: 38mm Hg (S).  Discharge Medications:  Current Discharge Medication List    START taking these medications   Details  aspirin 325 MG tablet Take 1 tablet (325 mg total) by mouth daily.      CONTINUE these medications which have CHANGED   Details  metoprolol (LOPRESSOR) 100 MG tablet  Take 1 tablet (100 mg total) by mouth 2 (two) times daily. Qty: 60 tablet, Refills: 3    naproxen (NAPROSYN) 500 MG tablet Take 1 tablet (500 mg total) by mouth daily as needed (for pain. This can increase the risk of stomach bleeding in patients taking aspirin, so if you develop stomach upset, stop taking.).      CONTINUE these medications which have NOT CHANGED   Details  Multiple Vitamin (MULTIVITAMIN) tablet Take 1 tablet by mouth daily.      fish oil-omega-3 fatty acids 1000 MG capsule Take 1 g by mouth 2 (two) times daily.          Disposition:  The  patient will be discharged in stable condition to home. Discharge Orders    Future Orders Please Complete By Expires   Diet - low sodium heart healthy      Increase activity slowly      Comments:   No driving for 2 days. No lifting over 5 lbs for 1 week. No sexual activity for 1 week.   Discharge wound care:      Comments:   Keep procedure site clean & dry. If you notice increased pain, swelling, bleeding or pus, call/return!  You may shower, but no soaking baths/hot tubs/pools for 1 week.       Follow-up Information    Follow up with Cassell Clement, MD. (We will call you with appointment)    Contact information:   [address] [place], [place] [phone]      Follow up with Crawford Givens, MD. (Please follow up with PCP - may need sleep study to look for sleep apnea. Also have your blood sugars followed - you are not diabetic, but your blood sugars here in the hospital show you may be at risk in the future.)    Contact information:   [address] [place] [address] [phone]          Duration of Discharge Encounter: Greater than 30 minutes including physician and PA time.  Signed, Ronie Spies PA-C 08/05/2011, 8:23 AM  [id number]

## 2011-08-12 DIAGNOSIS — I480 Paroxysmal atrial fibrillation: Secondary | ICD-10-CM

## 2011-08-12 DIAGNOSIS — Z9889 Other specified postprocedural states: Secondary | ICD-10-CM

## 2011-08-12 HISTORY — PX: CARDIAC CATHETERIZATION: SHX172

## 2011-08-12 HISTORY — DX: Other specified postprocedural states: Z98.890

## 2011-08-12 HISTORY — DX: Paroxysmal atrial fibrillation: I48.0

## 2011-08-18 ENCOUNTER — Ambulatory Visit (INDEPENDENT_AMBULATORY_CARE_PROVIDER_SITE_OTHER): Payer: 59 | Admitting: Nurse Practitioner

## 2011-08-18 ENCOUNTER — Encounter: Payer: Self-pay | Admitting: Nurse Practitioner

## 2011-08-18 VITALS — BP 140/98 | HR 50 | Ht 70.0 in | Wt >= 6400 oz

## 2011-08-18 DIAGNOSIS — Z9889 Other specified postprocedural states: Secondary | ICD-10-CM

## 2011-08-18 DIAGNOSIS — I480 Paroxysmal atrial fibrillation: Secondary | ICD-10-CM

## 2011-08-18 DIAGNOSIS — I10 Essential (primary) hypertension: Secondary | ICD-10-CM

## 2011-08-18 DIAGNOSIS — I4891 Unspecified atrial fibrillation: Secondary | ICD-10-CM

## 2011-08-18 NOTE — Progress Notes (Signed)
Benjamin Patterson Date of Birth: [birth year] Medical Record #[id number]  History of Present Illness: Benjamin Patterson is seen today for a post hospital visit. He is seen for Dr. Patty Sermons. He is a morbidly obese male who was hospitalized for SOB and chest pain. He was noted to be in atrial fib with RVR. He did convert. He underwent cardiac cath to risk stratify. He was felt to be a poor candidate for a nuclear study given his size. His cath was ok. No CAD. An echo showed an EF of 55 to 60% with mild LVH. There is the concern for sleep apnea.  He has done fine since discharge. No chest pain. No palpitations. His breathing is ok. He will be seeing his PCP to discuss sleep study. Blood pressure is up here today. He does not check at home but has a cuff.   Current Outpatient Prescriptions on File Prior to Visit  Medication Sig Dispense Refill  . aspirin 325 MG tablet Take 1 tablet (325 mg total) by mouth daily.      . Coenzyme Q10 (CO Q 10 PO) Take by mouth daily.        . fish oil-omega-3 fatty acids 1000 MG capsule Take 1 g by mouth 3 (three) times daily.       . metoprolol (LOPRESSOR) 100 MG tablet Take 1 tablet (100 mg total) by mouth 2 (two) times daily.  60 tablet  3  . Multiple Vitamin (MULTIVITAMIN) tablet Take 1 tablet by mouth daily.        . naproxen (NAPROSYN) 500 MG tablet Take 1 tablet (500 mg total) by mouth daily as needed (for pain. This can increase the risk of stomach bleeding in patients taking aspirin, so if you develop stomach upset, stop taking.).        No Known Allergies  Past Medical History  Diagnosis Date  . Hypertension   . Knee pain, bilateral   . Obesity   . Gout   . PAF (paroxysmal atrial fibrillation) Dec 2012  . S/P cardiac cath Dec 2012    Normal coronaries per cath    Past Surgical History  Procedure Date  . Cardiac catheterization Dec 2012    Normal.    History  Smoking status  . Never Smoker   Smokeless tobacco  . Not on file    History  Alcohol  Use No    Family History  Problem Relation Age of Onset  . Heart disease Mother     Afib  . Heart disease Father     CABG X 3 (05), HTN, elevated cholesterol, TKR  . Hypertension Father   . Hyperlipidemia Father   . Hypertension Sister   . Cancer Maternal Grandmother     Ovarian cancer  . Stroke Maternal Grandfather   . Diabetes Neg Hx   . Depression Neg Hx   . Alcohol abuse Neg Hx   . Drug abuse Neg Hx   . Prostate cancer Neg Hx   . Colon cancer Neg Hx     Review of Systems: The review of systems is per the HPI. 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  No problem with his cath site (right radial). All other systems were reviewed and are negative.  Physical Exam: BP 140/98  Pulse 50  Ht 5\' 10"  (1.778 m)  Wt 474 lb (215.005 kg)  BMI 68.01 kg/m2 Patient is very pleasant and in no acute distress. He is morbidly obese. Skin  is warm and dry. Color is normal.  HEENT is unremarkable. Normocephalic/atraumatic. PERRL. Sclera are nonicteric. Neck is supple. No masses. No JVD. Lungs are clear. Cardiac exam shows a regular rate and rhythm. Abdomen is obese but soft. Extremities are without edema. Right arm pulses are satisfactory. Gait and ROM are intact. No gross neurologic deficits noted.   LABORATORY DATA: N/A   Assessment / Plan:

## 2011-08-18 NOTE — Patient Instructions (Signed)
Check your blood pressure at least once a day. Keep a diary.  I will see you in a month. Goal blood pressure is to be less than 135/85.  Call the [facility] office at [phone] if you have any questions, problems or concerns.    Lab Results  Component Value Date   WBC 7.2 08/05/2011   HGB 12.5* 08/05/2011   HCT 38.8* 08/05/2011   PLT 211 08/05/2011   GLUCOSE 114* 08/03/2011   CHOL 140 08/03/2011   TRIG 62 08/03/2011   HDL 41 08/03/2011   LDLCALC 87 08/03/2011   ALT 19 06/03/2009   AST 19 06/03/2009   NA 141 08/03/2011   K 4.3 08/03/2011   CL 106 08/03/2011   CREATININE 0.80 08/03/2011   BUN 8 08/03/2011   CO2 26 08/03/2011   TSH 1.22 06/03/2009   PSA 1.09 06/03/2009   INR 1.00 08/02/2011   HGBA1C 5.8* 08/03/2011

## 2011-08-18 NOTE — Assessment & Plan Note (Signed)
No CAD per cath. Needs to work on risk factors.

## 2011-08-18 NOTE — Assessment & Plan Note (Signed)
He is doing well. He remains in a regular rhythm. His symptoms have resolved.

## 2011-08-18 NOTE — Assessment & Plan Note (Signed)
Blood pressure is up today. He is willing to check at home. I will see him back in a month to reassess. I have asked him to follow up with primary care to discuss arranging a sleep study.

## 2011-09-18 ENCOUNTER — Encounter: Payer: Self-pay | Admitting: Family Medicine

## 2011-09-18 ENCOUNTER — Ambulatory Visit (INDEPENDENT_AMBULATORY_CARE_PROVIDER_SITE_OTHER): Payer: 59 | Admitting: Nurse Practitioner

## 2011-09-18 ENCOUNTER — Encounter: Payer: Self-pay | Admitting: Nurse Practitioner

## 2011-09-18 ENCOUNTER — Ambulatory Visit (INDEPENDENT_AMBULATORY_CARE_PROVIDER_SITE_OTHER): Payer: 59 | Admitting: Family Medicine

## 2011-09-18 VITALS — BP 142/100 | HR 67 | Temp 97.7°F | Wt >= 6400 oz

## 2011-09-18 VITALS — BP 154/104 | HR 64 | Ht 70.0 in | Wt >= 6400 oz

## 2011-09-18 DIAGNOSIS — I480 Paroxysmal atrial fibrillation: Secondary | ICD-10-CM

## 2011-09-18 DIAGNOSIS — G4733 Obstructive sleep apnea (adult) (pediatric): Secondary | ICD-10-CM

## 2011-09-18 DIAGNOSIS — I10 Essential (primary) hypertension: Secondary | ICD-10-CM

## 2011-09-18 DIAGNOSIS — I4891 Unspecified atrial fibrillation: Secondary | ICD-10-CM

## 2011-09-18 LAB — BASIC METABOLIC PANEL
BUN: 15 mg/dL (ref 6–23)
CO2: 27 mEq/L (ref 19–32)
Calcium: 8.9 mg/dL (ref 8.4–10.5)
Chloride: 108 mEq/L (ref 96–112)
Creatinine, Ser: 0.9 mg/dL (ref 0.4–1.5)
GFR: 93.9 mL/min (ref >60.00)
Glucose, Bld: 97 mg/dL (ref 70–99)
Potassium: 4.1 mEq/L (ref 3.5–5.1)
Sodium: 141 mEq/L (ref 135–145)

## 2011-09-18 MED ORDER — OLMESARTAN MEDOXOMIL-HCTZ 20-12.5 MG PO TABS: 1.0000 | ORAL_TABLET | Freq: Every day | ORAL | Status: DC

## 2011-09-18 NOTE — Patient Instructions (Signed)
See Shirlee Limerick about your referral before you leave today. I want your to gradually work on weight loss and let me know if I can help.

## 2011-09-18 NOTE — Progress Notes (Signed)
Benjamin Patterson Date of Birth: 04-27-1961 Medical Record #[id number]  History of Present Illness: Benjamin Patterson is seen today for a one month check. He is seen for Dr. Patty Sermons. He has had PAF and converted. Had cardiac cath which showed normal coronaries. He is morbidly obese. Comes in today for a one month check. Has been noting high blood pressures at home. Some headaches. Weight is up another 10 pounds. Not watching his salt. No chest pain. Rare "flutter" in his chest but not recurrent atrial fib.   Current Outpatient Prescriptions on File Prior to Visit  Medication Sig Dispense Refill  . aspirin 325 MG tablet Take 1 tablet (325 mg total) by mouth daily.      . Coenzyme Q10 (CO Q 10 PO) Take by mouth daily.        . fish oil-omega-3 fatty acids 1000 MG capsule Take 1 g by mouth 3 (three) times daily.       . metoprolol (LOPRESSOR) 100 MG tablet Take 1 tablet (100 mg total) by mouth 2 (two) times daily.  60 tablet  3  . Multiple Vitamin (MULTIVITAMIN) tablet Take 1 tablet by mouth daily.        . naproxen (NAPROSYN) 500 MG tablet Take 1 tablet (500 mg total) by mouth daily as needed (for pain. This can increase the risk of stomach bleeding in patients taking aspirin, so if you develop stomach upset, stop taking.).        No Known Allergies  Past Medical History  Diagnosis Date  . Hypertension   . Knee pain, bilateral   . Obesity   . Gout   . PAF (paroxysmal atrial fibrillation) Dec 2012  . S/P cardiac cath Dec 2012    Normal coronaries per cath    Past Surgical History  Procedure Date  . Cardiac catheterization Dec 2012    Normal.    History  Smoking status  . Never Smoker   Smokeless tobacco  . Not on file    History  Alcohol Use No    Family History  Problem Relation Age of Onset  . Heart disease Mother     Afib  . Heart disease Father     CABG X 3 (05), HTN, elevated cholesterol, TKR  . Hypertension Father   . Hyperlipidemia Father   . Hypertension Sister     . Cancer Maternal Grandmother     Ovarian cancer  . Stroke Maternal Grandfather   . Diabetes Neg Hx   . Depression Neg Hx   . Alcohol abuse Neg Hx   . Drug abuse Neg Hx   . Prostate cancer Neg Hx   . Colon cancer Neg Hx     Review of Systems: The review of systems is positive for headaches. ? Sinus issues. Seeing his PCP today to discuss probable sleep apnea.  All other systems were reviewed and are negative.  Physical Exam: BP 154/100  Pulse 64  Ht 5\' 10"  (1.778 m)  Wt 484 lb 12.8 oz (219.904 kg)  BMI 69.56 kg/m2 Patient is morbidly obese and in no acute distress. Weight is up another 10 pounds.Skin is warm and dry. Color is normal.  HEENT is unremarkable. Normocephalic/atraumatic. PERRL. Sclera are nonicteric. Neck is supple. No masses. No JVD. Lungs are clear. Cardiac exam shows a regular rate and rhythm today. Abdomen is obese but soft. Extremities are without edema. Gait and ROM are intact. No gross neurologic deficits noted.   LABORATORY DATA: BMET is pending.  Assessment / Plan:

## 2011-09-18 NOTE — Assessment & Plan Note (Signed)
Needs blood pressure management. I have started him on Benicar Hct 20-12.5 mg. Samples for 2 weeks are given. BMET is checked today. I will see him back in 2 weeks and recheck BMET on return. Salt restriction is encouraged. Weight reduction is going to be crucial. Patient is agreeable to this plan and will call if any problems develop in the interim.

## 2011-09-18 NOTE — Assessment & Plan Note (Signed)
No recurrence of his atrial fib. Will continue with his beta blocker.

## 2011-09-18 NOTE — Patient Instructions (Signed)
We are going to start you on blood pressure medicine. It is Benicar Hct 20/12.5. Take one a day.  We will check lab today and on your next visit.  I will see you in 2 weeks.  Cut back on the salt.  Call the [facility] office at [phone] if you have any questions, problems or concerns.

## 2011-09-19 ENCOUNTER — Encounter: Payer: Self-pay | Admitting: Family Medicine

## 2011-09-19 DIAGNOSIS — G4733 Obstructive sleep apnea (adult) (pediatric): Secondary | ICD-10-CM | POA: Insufficient documentation

## 2011-09-19 NOTE — Progress Notes (Signed)
Admitted with AF 2012.  No return of similar episodes. No CP now.  Likely OSA given observation in the hospital, per report.  Feels 'sluggish' but not aware of snoring.  D/w pt about weight and goals.  Sugar not elevated on recent check.  BP meds adjusted by cards.    Meds, vitals, and allergies reviewed.   ROS: See HPI.  Otherwise, noncontributory.  nad Obese Mmm rrr ctab abd soft, obese Ext with no edema

## 2011-09-19 NOTE — Assessment & Plan Note (Signed)
>  25 min spent with face to face with patient, >50% counseling and/or coordinating care.  Basics of OSA and sleep cycle d/w pt.  Given the observations during hospitalization with obesity and AF, it would be reasonable to have pt see pulm about sleep study.  He agrees.  He needs to lose weight and he's looking into a program at work.  He'll call back if this isn't helpful.  He declined nutrition eval/consult today.  F/u prn.

## 2011-09-21 ENCOUNTER — Telehealth: Payer: Self-pay | Admitting: *Deleted

## 2011-09-21 NOTE — Telephone Encounter (Signed)
Advised of labs 

## 2011-09-21 NOTE — Telephone Encounter (Signed)
Message copied by Burnell Blanks on Thu Sep 21, 2011  5:38 PM ------      Message from: Rosalio Macadamia      Created: Mon Sep 18, 2011  1:57 PM       Ok to report. Labs are satisfactory. Will recheck on return visit. Patient of Dr. Yevonne Pax.

## 2011-10-02 ENCOUNTER — Ambulatory Visit (INDEPENDENT_AMBULATORY_CARE_PROVIDER_SITE_OTHER): Payer: 59 | Admitting: Pulmonary Disease

## 2011-10-02 ENCOUNTER — Institutional Professional Consult (permissible substitution): Payer: 59 | Admitting: Pulmonary Disease

## 2011-10-02 ENCOUNTER — Ambulatory Visit (INDEPENDENT_AMBULATORY_CARE_PROVIDER_SITE_OTHER): Payer: 59 | Admitting: Nurse Practitioner

## 2011-10-02 ENCOUNTER — Other Ambulatory Visit (INDEPENDENT_AMBULATORY_CARE_PROVIDER_SITE_OTHER): Payer: 59 | Admitting: *Deleted

## 2011-10-02 ENCOUNTER — Encounter: Payer: Self-pay | Admitting: Nurse Practitioner

## 2011-10-02 ENCOUNTER — Telehealth: Payer: Self-pay | Admitting: Nurse Practitioner

## 2011-10-02 ENCOUNTER — Encounter: Payer: Self-pay | Admitting: Pulmonary Disease

## 2011-10-02 VITALS — BP 110/78 | HR 60 | Ht 70.0 in | Wt >= 6400 oz

## 2011-10-02 VITALS — BP 110/72 | HR 50 | Temp 98.0°F | Ht 70.0 in | Wt >= 6400 oz

## 2011-10-02 DIAGNOSIS — I480 Paroxysmal atrial fibrillation: Secondary | ICD-10-CM

## 2011-10-02 DIAGNOSIS — I10 Essential (primary) hypertension: Secondary | ICD-10-CM

## 2011-10-02 DIAGNOSIS — E669 Obesity, unspecified: Secondary | ICD-10-CM

## 2011-10-02 DIAGNOSIS — I4891 Unspecified atrial fibrillation: Secondary | ICD-10-CM

## 2011-10-02 DIAGNOSIS — M109 Gout, unspecified: Secondary | ICD-10-CM | POA: Insufficient documentation

## 2011-10-02 DIAGNOSIS — R0989 Other specified symptoms and signs involving the circulatory and respiratory systems: Secondary | ICD-10-CM

## 2011-10-02 DIAGNOSIS — G4733 Obstructive sleep apnea (adult) (pediatric): Secondary | ICD-10-CM

## 2011-10-02 DIAGNOSIS — R635 Abnormal weight gain: Secondary | ICD-10-CM

## 2011-10-02 DIAGNOSIS — R0683 Snoring: Secondary | ICD-10-CM

## 2011-10-02 DIAGNOSIS — R0609 Other forms of dyspnea: Secondary | ICD-10-CM

## 2011-10-02 LAB — LIPID PANEL
Cholesterol: 118 mg/dL (ref 0–200)
HDL: 31 mg/dL — ABNORMAL LOW (ref >39.00)
Triglycerides: 108 mg/dL (ref 0.0–149.0)

## 2011-10-02 LAB — BASIC METABOLIC PANEL
BUN: 27 mg/dL — ABNORMAL HIGH (ref 6–23)
CO2: 26 mEq/L (ref 19–32)
Calcium: 9.2 mg/dL (ref 8.4–10.5)
Chloride: 95 mEq/L — ABNORMAL LOW (ref 96–112)
Creatinine, Ser: 1.4 mg/dL (ref 0.4–1.5)
GFR: 58.8 mL/min — ABNORMAL LOW (ref >60.00)
Glucose, Bld: 96 mg/dL (ref 70–99)
Potassium: 4.5 mEq/L (ref 3.5–5.1)
Sodium: 132 mEq/L — ABNORMAL LOW (ref 135–145)

## 2011-10-02 LAB — TSH: TSH: 1.01 u[IU]/mL (ref 0.35–5.50)

## 2011-10-02 LAB — URIC ACID: Uric Acid, Serum: 11.5 mg/dL — ABNORMAL HIGH (ref 4.0–7.8)

## 2011-10-02 MED ORDER — COLCHICINE 0.6 MG PO TABS: 0.6000 mg | ORAL_TABLET | Freq: Two times a day (BID) | ORAL | Status: DC | PRN

## 2011-10-02 MED ORDER — OLMESARTAN MEDOXOMIL-HCTZ 20-12.5 MG PO TABS: 1.0000 | ORAL_TABLET | Freq: Every day | ORAL | Status: DC

## 2011-10-02 NOTE — Assessment & Plan Note (Signed)
Will check uric acid level today. I have given a Rx for colchicine 0.6 mg BID. He is cautioned about possibility of diarrhea.

## 2011-10-02 NOTE — Assessment & Plan Note (Signed)
He remains in sinus.

## 2011-10-02 NOTE — Assessment & Plan Note (Signed)
Seeing Dr. Shelle Iron for discussion later today.

## 2011-10-02 NOTE — Assessment & Plan Note (Signed)
Blood pressure is so much better. I have given him the ok to cut the Benicar Hct in half for readings below 110 systolic. He will continue to monitor at home. I will see him in 2 months. Patient is agreeable to this plan and will call if any problems develop in the interim.

## 2011-10-02 NOTE — Assessment & Plan Note (Signed)
The patient has a history of snoring, but is not overly suspicious for clinically significant sleep disordered breathing.  He feels that he has adequate restorative sleep, and denies any significant sleepiness issue during the day or night.  I have had a long conversation with him about the pathophysiology of sleep apnea, including its impact on his quality of life and cardiovascular health.  Given his lack of symptoms, the fact his atrial fibrillation has not recurred, and that he has lost significant weight over the last one to 2 months, we have decided to hold off on a sleep study and see how he responds.  If the patient has any further atrial fibrillation, he understands it will be very important to proceed with a sleep study.  He will also monitor his symptoms very carefully, and if he has any worsening, will call to arrange the study.

## 2011-10-02 NOTE — Assessment & Plan Note (Signed)
He is down 32 pounds with dietary measures and counting calories. Will check BMET today.

## 2011-10-02 NOTE — Progress Notes (Signed)
Benjamin Patterson Date of Birth: [birth year] Medical Record #[id number]  History of Present Illness: Benjamin Patterson is seen today for a follow up visit. He is seen for Dr. Patty Sermons. He has had PAF and converted. His cardiac cath showed normal coronaries. He is morbidly obese. We started him on Benicar Hct at his last visit for his blood pressure.  He comes in today. He is doing so much better. No cardiac complaints. Blood pressure has come down nicely. He has felt a little "fuzzy" at times. Blood pressure was a little low. He has lost 32 pounds with dietary measures. He is complaining of some gout in his right great toe. Rhythm has been ok.   Current Outpatient Prescriptions on File Prior to Visit  Medication Sig Dispense Refill  . aspirin 325 MG tablet Take 1 tablet (325 mg total) by mouth daily.      . Coenzyme Q10 (CO Q 10 PO) Take by mouth daily.        . fish oil-omega-3 fatty acids 1000 MG capsule Take 1 g by mouth 3 (three) times daily.       . metoprolol (LOPRESSOR) 100 MG tablet Take 1 tablet (100 mg total) by mouth 2 (two) times daily.  60 tablet  3  . Multiple Vitamin (MULTIVITAMIN) tablet Take 1 tablet by mouth daily.        . naproxen (NAPROSYN) 500 MG tablet Take 1 tablet (500 mg total) by mouth daily as needed (for pain. This can increase the risk of stomach bleeding in patients taking aspirin, so if you develop stomach upset, stop taking.).      Marland Kitchen olmesartan-hydrochlorothiazide (BENICAR HCT) 20-12.5 MG per tablet Take 1 tablet by mouth daily.        No Known Allergies  Past Medical History  Diagnosis Date  . Hypertension   . Knee pain, bilateral   . Obesity   . Gout   . PAF (paroxysmal atrial fibrillation) Dec 2012  . S/P cardiac cath Dec 2012    Normal coronaries per cath    Past Surgical History  Procedure Date  . Cardiac catheterization Dec 2012    Normal.    History  Smoking status  . Never Smoker   Smokeless tobacco  . Not on file    History  Alcohol Use No      Family History  Problem Relation Age of Onset  . Heart disease Mother     Afib  . Heart disease Father     CABG X 3 (05), HTN, elevated cholesterol, TKR  . Hypertension Father   . Hyperlipidemia Father   . Hypertension Sister   . Cancer Maternal Grandmother     Ovarian cancer  . Stroke Maternal Grandfather   . Diabetes Neg Hx   . Depression Neg Hx   . Alcohol abuse Neg Hx   . Drug abuse Neg Hx   . Prostate cancer Neg Hx   . Colon cancer Neg Hx     Review of Systems: The review of systems is positive for gout in the right great toe. No cardiac complaints.  All other systems were reviewed and are negative.  Physical Exam: BP 110/78  Pulse 60  Ht 5\' 10"  (1.778 m)  Wt 452 lb (205.026 kg)  BMI 64.86 kg/m2 Patient is very pleasant and in no acute distress. He is obese. Skin is warm and dry. Color is normal.  HEENT is unremarkable. Normocephalic/atraumatic. PERRL. Sclera are nonicteric. Neck is supple. No masses. No  JVD. Lungs are clear. Cardiac exam shows a regular rate and rhythm. Abdomen is obese but soft. Extremities are without edema. Gait and ROM are intact. No gross neurologic deficits noted.   LABORATORY DATA: PENDING  Assessment / Plan:

## 2011-10-02 NOTE — Patient Instructions (Signed)
Will hold off on sleep study for now due to your absence of symptoms, but would proceed if your afib recurs or if you find you are more symptomatic than you realize. Work aggressively on weight loss

## 2011-10-02 NOTE — Telephone Encounter (Signed)
New problem Patient said he was here this morning Pt said his pharmacy[facility] in [place] didn't receive benicar-hct rx please send

## 2011-10-02 NOTE — Progress Notes (Signed)
  Subjective:    Patient ID: Benjamin Patterson, male    DOB: Nov 28, 1960, 52 y.o.   MRN: [id number]  HPI The patient is a 51 year old male who I have been asked to see for possible obstructive sleep apnea.  He is morbidly obese, and recently had an isolated episode of atrial fibrillation in November of last year.  This has resolved, and the patient has had no further episodes.  However, the question is been raised whether he may have obstructive sleep apnea.  The patient states that he has been told he has snoring, but no one has ever commented on an abnormal breathing pattern during sleep.  He denies any choking arousals.  He has minimal awakenings during the night, and feels rested upon arising each a.m.  He has very occasional sleep pressure during the day with periods of inactivity, but this is not severe or consistent.  He has no issues with sleepiness in the evening while watching television or reading, and no sleepiness while driving.  His Epworth sleepiness score today is only 3, and his weight is neutral over the last 1-2 years.  Sleep Questionnaire: What time do you typically go to bed?( Between what hours) 10 pm - 11:30 pm How long does it take you to fall asleep? 15-20 minutes How many times during the night do you wake up? 2 What time do you get out of bed to start your day? 0530 Do you drive or operate heavy machinery in your occupation? No How much has your weight changed (up or down) over the past two years? (In pounds) 30 lb (13.608 kg) Have you ever had a sleep study before? No Do you currently use CPAP? No Do you wear oxygen at any time? No    Review of Systems  Constitutional: Positive for unexpected weight change. Negative for fever.  HENT: Negative for ear pain, nosebleeds, congestion, sore throat, rhinorrhea, sneezing, trouble swallowing, dental problem, postnasal drip and sinus pressure.   Eyes: Negative for redness and itching.  Respiratory: Positive for shortness of breath.  Negative for cough, chest tightness and wheezing.   Cardiovascular: Negative for palpitations and leg swelling.  Gastrointestinal: Negative for nausea and vomiting.  Genitourinary: Negative for dysuria.  Musculoskeletal: Positive for joint swelling.  Skin: Negative for rash.  Neurological: Negative for headaches.  Hematological: Does not bruise/bleed easily.  Psychiatric/Behavioral: Negative for dysphoric mood. The patient is not nervous/anxious.        Objective:   Physical Exam Constitutional: morbidly obese, no acute distress  HENT:  Nares patent without discharge  Oropharynx without exudate, palate and uvula are thickened and elongated.  Eyes:  Perrla, eomi, no scleral icterus  Neck:  No JVD, no TMG  Cardiovascular:  Normal rate, regular rhythm, no rubs or gallops.  No murmurs        Intact distal pulses  Pulmonary :  Normal breath sounds, no stridor or respiratory distress   No rales, rhonchi, or wheezing  Abdominal:  Soft, nondistended, bowel sounds present.  No tenderness noted.   Musculoskeletal:  mild lower extremity edema noted.  Lymph Nodes:  No cervical lymphadenopathy noted  Skin:  No cyanosis noted  Neurologic:  Alert, does not appear sleepy, moves all 4 extremities without obvious deficit.         Assessment & Plan:

## 2011-10-02 NOTE — Patient Instructions (Signed)
Stay on your current medicines.  If your blood pressure remains below 110 systolic, cut the Benicar Hct in half.  Continue to monitor your blood pressure.  We will put you on some Colchicine 0.6 mg two times a day as needed for gout.  We will check some labs today.  We will see you back 2 months.   Call the [facility] office at [phone] if you have any questions, problems or concerns.

## 2011-10-04 ENCOUNTER — Telehealth: Payer: Self-pay | Admitting: *Deleted

## 2011-10-04 DIAGNOSIS — I119 Hypertensive heart disease without heart failure: Secondary | ICD-10-CM

## 2011-10-04 MED ORDER — ALLOPURINOL 100 MG PO TABS: 100.0000 mg | ORAL_TABLET | Freq: Two times a day (BID) | ORAL | Status: DC

## 2011-10-04 MED ORDER — METHYLPREDNISOLONE 4 MG PO KIT: PACK | ORAL | Status: AC

## 2011-10-04 NOTE — Telephone Encounter (Signed)
Advised of labs Patient still having a lot of discomfort from gout.  Also having diarrhea from colchicine.  Discussed with  Dr. Patty Sermons and will send over medrol dosepack, add Aollopurinol 100 mg 2 daily, and ok to decrease colchine to 1 dialy.  Advised patient

## 2011-10-04 NOTE — Telephone Encounter (Signed)
Message copied by Burnell Blanks on Wed Oct 04, 2011  6:24 PM ------      Message from: Rosalio Macadamia      Created: Mon Oct 02, 2011  3:08 PM       Ok to report. Uric acid is elevated. Has a gout flare up and I gave him colchicine. Need to consider allopurinol on return as maintenance dose.

## 2011-10-04 NOTE — Telephone Encounter (Signed)
Message copied by Burnell Blanks on Wed Oct 04, 2011  6:26 PM ------      Message from: Rosalio Macadamia      Created: Mon Oct 02, 2011  3:10 PM       Ok to report. Try to limit use of NSAID's. Recheck BMET in 2 weeks.

## 2011-10-18 ENCOUNTER — Other Ambulatory Visit (INDEPENDENT_AMBULATORY_CARE_PROVIDER_SITE_OTHER): Payer: 59 | Admitting: *Deleted

## 2011-10-18 ENCOUNTER — Telehealth: Payer: Self-pay | Admitting: *Deleted

## 2011-10-18 DIAGNOSIS — I119 Hypertensive heart disease without heart failure: Secondary | ICD-10-CM

## 2011-10-18 LAB — BASIC METABOLIC PANEL
BUN: 28 mg/dL — ABNORMAL HIGH (ref 6–23)
CO2: 26 mEq/L (ref 19–32)
Chloride: 100 mEq/L (ref 96–112)
Creatinine, Ser: 1.4 mg/dL (ref 0.4–1.5)
Glucose, Bld: 108 mg/dL — ABNORMAL HIGH (ref 70–99)

## 2011-10-18 NOTE — Telephone Encounter (Signed)
Go ahead and stop it now and check uric acid level in next several days to see if dose of allopurinol is sufficient to keep uric acid level down.

## 2011-10-18 NOTE — Telephone Encounter (Signed)
Advised patient of labs results  Gout pain is better, does he need to continue colchicine 1 a day, is taking allopurinol Will forward to  Dr. Patty Sermons

## 2011-10-18 NOTE — Telephone Encounter (Signed)
Was already decreased to once daily secondary to GI problems when Medrol Dosepak Rx'd

## 2011-10-18 NOTE — Telephone Encounter (Signed)
Message copied by Burnell Blanks on Wed Oct 18, 2011  4:38 PM ------      Message from: Cassell Clement      Created: Wed Oct 18, 2011  2:12 PM             Please report.  The labs are stable.  Continue same meds.  Continue careful diet. Kidneys are slightly dry so drink more water.  BS higher--watch carbs

## 2011-10-18 NOTE — Telephone Encounter (Signed)
Decrease colchicine to once a day for another 2 weeks then try leaving it off altogether.  We should recheck a serum uric acid in about 2 weeks to be sure he is on enough allopurinol.

## 2011-10-20 ENCOUNTER — Telehealth: Payer: Self-pay | Admitting: *Deleted

## 2011-10-20 MED ORDER — ALLOPURINOL 300 MG PO TABS: 300.0000 mg | ORAL_TABLET | Freq: Every day | ORAL | Status: DC

## 2011-10-20 NOTE — Telephone Encounter (Signed)
Advised patient and will get uric acid added to labs

## 2011-10-20 NOTE — Telephone Encounter (Signed)
Message copied by Burnell Blanks on Fri Oct 20, 2011  3:57 PM ------      Message from: Cassell Clement      Created: Fri Oct 20, 2011  2:38 PM       Increase allopurinol to 300 mg daily.  Next time he gets Rx filled we should switch to the 300 mg size pill.

## 2011-10-20 NOTE — Telephone Encounter (Signed)
Advised patient

## 2011-12-01 ENCOUNTER — Ambulatory Visit: Payer: 59 | Admitting: Nurse Practitioner

## 2012-03-22 ENCOUNTER — Other Ambulatory Visit: Payer: Self-pay | Admitting: Physician Assistant

## 2012-03-22 ENCOUNTER — Other Ambulatory Visit: Payer: Self-pay

## 2012-03-22 NOTE — Telephone Encounter (Signed)
Pt left v/m refill metoprolol. Spoke with pt Benjamin Patterson at [facility] said already filled by [facility]. Pt notified ready for pick up.

## 2012-05-08 ENCOUNTER — Other Ambulatory Visit: Payer: Self-pay | Admitting: Nurse Practitioner

## 2012-11-06 ENCOUNTER — Other Ambulatory Visit: Payer: Self-pay

## 2012-11-06 MED ORDER — METOPROLOL TARTRATE 100 MG PO TABS: ORAL_TABLET | ORAL | Status: DC

## 2012-11-06 NOTE — Telephone Encounter (Signed)
Sent.  Needs CPE scheduled.

## 2012-11-06 NOTE — Telephone Encounter (Signed)
Pt left v/m requesting refill metoprolol to [facility] [address]. Pt last seen01/08/13 for f/u [facility] due to atrial fib.Please advise.

## 2013-04-30 ENCOUNTER — Other Ambulatory Visit: Payer: Self-pay | Admitting: Family Medicine

## 2013-05-02 ENCOUNTER — Other Ambulatory Visit: Payer: Self-pay

## 2013-05-02 MED ORDER — METOPROLOL TARTRATE 100 MG PO TABS: ORAL_TABLET | ORAL | Status: DC

## 2013-05-02 NOTE — Telephone Encounter (Signed)
Pt left v/m pt scheduled CPX 06/16/13; pt request refill metoprolol to [facility] [address] until CPX.pt notified done.

## 2013-05-15 ENCOUNTER — Ambulatory Visit: Payer: 59 | Admitting: Family Medicine

## 2013-06-16 ENCOUNTER — Encounter: Payer: Self-pay | Admitting: Family Medicine

## 2013-06-16 ENCOUNTER — Ambulatory Visit (INDEPENDENT_AMBULATORY_CARE_PROVIDER_SITE_OTHER): Payer: 59 | Admitting: Family Medicine

## 2013-06-16 VITALS — BP 142/92 | HR 85 | Temp 98.1°F | Wt >= 6400 oz

## 2013-06-16 DIAGNOSIS — I10 Essential (primary) hypertension: Secondary | ICD-10-CM

## 2013-06-16 DIAGNOSIS — Z0000 Encounter for general adult medical examination without abnormal findings: Secondary | ICD-10-CM

## 2013-06-16 DIAGNOSIS — I4891 Unspecified atrial fibrillation: Secondary | ICD-10-CM

## 2013-06-16 DIAGNOSIS — Z23 Encounter for immunization: Secondary | ICD-10-CM

## 2013-06-16 DIAGNOSIS — E7800 Pure hypercholesterolemia, unspecified: Secondary | ICD-10-CM

## 2013-06-16 DIAGNOSIS — M109 Gout, unspecified: Secondary | ICD-10-CM

## 2013-06-16 LAB — COMPREHENSIVE METABOLIC PANEL
CO2: 28 mEq/L (ref 19–32)
GFR: 97.01 mL/min (ref >60.00)
Glucose, Bld: 87 mg/dL (ref 70–99)
Sodium: 137 mEq/L (ref 135–145)
Total Bilirubin: 0.7 mg/dL (ref 0.3–1.2)
Total Protein: 8.1 g/dL (ref 6.0–8.3)

## 2013-06-16 LAB — LIPID PANEL
Cholesterol: 140 mg/dL (ref 0–200)
VLDL: 16.6 mg/dL (ref 0.0–40.0)

## 2013-06-16 LAB — URIC ACID: Uric Acid, Serum: 8.7 mg/dL — ABNORMAL HIGH (ref 4.0–7.8)

## 2013-06-16 MED ORDER — METOPROLOL TARTRATE 100 MG PO TABS: ORAL_TABLET | ORAL | Status: DC

## 2013-06-16 NOTE — Assessment & Plan Note (Signed)
Now with stasis dermatitis changes on the BLE. Diet and exercise d/w pt.  He is working with [employer] to track calories.  We talked about motivation and accountability.  He wants to plan for f/u in about 3 months for a weight check.

## 2013-06-16 NOTE — Assessment & Plan Note (Signed)
Generally controlled by symptoms.  D/w pt about weight loss.  This is the main issue here.  He agrees.  Edema would likely improve with dec in weight.

## 2013-06-16 NOTE — Assessment & Plan Note (Signed)
Continue current meds.  Needs to lose weight, discussed.

## 2013-06-16 NOTE — Assessment & Plan Note (Signed)
Routine anticipatory guidance given to patient.  See health maintenance. Tetanus 2014 Flu 2014 Diet and exercise d/w pt.  He is working with [employer] to track calories.  We talked about motivation and accountability.  He wants to plan for f/u in about 3 months for a weight check.  Living will done- wife designated.  D/w patient [id number] for colon cancer screening, including IFOB vs. colonoscopy.  Risks and benefits of both were discussed and patient voiced understanding.  Pt elects [id number].  Prostate cancer screening and PSA options (with potential risks and benefits of testing vs not testing) were discussed along with recent recs/guidelines.  He declined testing PSA at this point. Due for labs.

## 2013-06-16 NOTE — Assessment & Plan Note (Signed)
See notes on labs.  No recent flares.

## 2013-06-16 NOTE — Progress Notes (Signed)
CPE- See plan.  Routine anticipatory guidance given to patient.  See health maintenance. Tetanus 2014 Flu 2014 Diet and exercise d/w pt.  He is working with [employer] to track calories.  We talked about motivation and accountability.  He wants to plan for f/u in about 3 months for a weight check.  Living will done- wife designated.  D/w patient [id number] for colon cancer screening, including IFOB vs. colonoscopy.  Risks and benefits of both were discussed and patient voiced understanding.  Pt elects [id number].  Prostate cancer screening and PSA options (with potential risks and benefits of testing vs not testing) were discussed along with recent recs/guidelines.  He declined testing PSA at this point. Due for labs.    Gout.  No recent flares.  Off meds.  Due for labs.   Obesity-  Now with stasis dermatitis changes on the BLE. Diet and exercise d/w pt.  He is working with [employer] to track calories.  We talked about motivation and accountability.  He wants to plan for f/u in about 3 months for a weight check.   Hypertension and h/o AF.  Rare fluttering in the chest.  No sustained AF.  Using medication without problems or lightheadedness: no Chest pain with exertion:no Edema:at baseline Short of breath:no  PMH and SH reviewed  Meds, vitals, and allergies reviewed.   ROS: See HPI.  Otherwise negative.    GEN: nad, alert and oriented, morbidly obese HEENT: mucous membranes moist NECK: supple w/o LA CV: rrr. PULM: ctab, no inc wob ABD: soft, +bs EXT: nonpitting edema with stasis changes on the BLE SKIN: no acute rash

## 2013-06-16 NOTE — Patient Instructions (Addendum)
Recheck in 3-4 months.  Work on your [employer] in the meantime.  Go to the lab on the way out.  We'll contact you with your lab report. Take care.  Glad to see you.   I sent your med the pharmacy.

## 2013-06-17 ENCOUNTER — Encounter: Payer: Self-pay | Admitting: *Deleted

## 2013-06-19 ENCOUNTER — Telehealth: Payer: Self-pay

## 2013-06-19 NOTE — Telephone Encounter (Signed)
Pt request lab results from 06/17/13.Patient notified as instructed by telephone.

## 2013-09-25 ENCOUNTER — Ambulatory Visit: Payer: 59 | Admitting: Family Medicine

## 2013-10-11 ENCOUNTER — Encounter (HOSPITAL_COMMUNITY): Payer: Self-pay | Admitting: Emergency Medicine

## 2013-10-11 ENCOUNTER — Inpatient Hospital Stay (HOSPITAL_COMMUNITY)
Admission: EM | Admit: 2013-10-11 | Discharge: 2013-10-13 | DRG: 603 | Disposition: A | Discharge status: Home / Self Care | Payer: 59 | Attending: Internal Medicine | Admitting: Internal Medicine

## 2013-10-11 DIAGNOSIS — Z823 Family history of stroke: Secondary | ICD-10-CM

## 2013-10-11 DIAGNOSIS — I480 Paroxysmal atrial fibrillation: Secondary | ICD-10-CM

## 2013-10-11 DIAGNOSIS — I872 Venous insufficiency (chronic) (peripheral): Secondary | ICD-10-CM | POA: Diagnosis present

## 2013-10-11 DIAGNOSIS — M109 Gout, unspecified: Secondary | ICD-10-CM | POA: Diagnosis present

## 2013-10-11 DIAGNOSIS — Z8249 Family history of ischemic heart disease and other diseases of the circulatory system: Secondary | ICD-10-CM

## 2013-10-11 DIAGNOSIS — M25569 Pain in unspecified knee: Secondary | ICD-10-CM

## 2013-10-11 DIAGNOSIS — I4891 Unspecified atrial fibrillation: Secondary | ICD-10-CM

## 2013-10-11 DIAGNOSIS — I10 Essential (primary) hypertension: Secondary | ICD-10-CM | POA: Diagnosis present

## 2013-10-11 DIAGNOSIS — L02419 Cutaneous abscess of limb, unspecified: Principal | ICD-10-CM | POA: Diagnosis present

## 2013-10-11 DIAGNOSIS — L03119 Cellulitis of unspecified part of limb: Principal | ICD-10-CM

## 2013-10-11 DIAGNOSIS — Z7982 Long term (current) use of aspirin: Secondary | ICD-10-CM

## 2013-10-11 DIAGNOSIS — L0390 Cellulitis, unspecified: Secondary | ICD-10-CM

## 2013-10-11 DIAGNOSIS — Z6841 Body Mass Index (BMI) 40.0 and over, adult: Secondary | ICD-10-CM

## 2013-10-11 DIAGNOSIS — R0683 Snoring: Secondary | ICD-10-CM

## 2013-10-11 NOTE — ED Notes (Signed)
Pt c/o bilateral lower leg swelling with right leg red and weeping. Pt stated he noticed scratches on right leg on Christmas that have not healed. Hx Afib.

## 2013-10-12 ENCOUNTER — Emergency Department (HOSPITAL_COMMUNITY): Payer: 59

## 2013-10-12 DIAGNOSIS — L0291 Cutaneous abscess, unspecified: Secondary | ICD-10-CM

## 2013-10-12 DIAGNOSIS — L0390 Cellulitis, unspecified: Secondary | ICD-10-CM | POA: Diagnosis present

## 2013-10-12 DIAGNOSIS — I4891 Unspecified atrial fibrillation: Secondary | ICD-10-CM

## 2013-10-12 LAB — BASIC METABOLIC PANEL
BUN: 12 mg/dL (ref 6–23)
CALCIUM: 9 mg/dL (ref 8.4–10.5)
CO2: 22 mEq/L (ref 19–32)
Chloride: 98 mEq/L (ref 96–112)
Creatinine, Ser: 0.97 mg/dL (ref 0.50–1.35)
Glucose, Bld: 114 mg/dL — ABNORMAL HIGH (ref 70–99)
POTASSIUM: 3.8 meq/L (ref 3.7–5.3)
SODIUM: 136 meq/L — AB (ref 137–147)

## 2013-10-12 LAB — CBC WITH DIFFERENTIAL/PLATELET
BASOS ABS: 0.1 10*3/uL (ref 0.0–0.1)
BASOS PCT: 1 % (ref 0–1)
EOS ABS: 0 10*3/uL (ref 0.0–0.7)
EOS PCT: 0 % (ref 0–5)
HCT: 38.3 % — ABNORMAL LOW (ref 39.0–52.0)
Hemoglobin: 12.6 g/dL — ABNORMAL LOW (ref 13.0–17.0)
LYMPHS ABS: 2.5 10*3/uL (ref 0.7–4.0)
Lymphocytes Relative: 18 % (ref 12–46)
MCH: 27 pg (ref 26.0–34.0)
MCHC: 32.9 g/dL (ref 30.0–36.0)
MCV: 82.2 fL (ref 78.0–100.0)
Monocytes Absolute: 0.8 10*3/uL (ref 0.1–1.0)
Monocytes Relative: 6 % (ref 3–12)
NEUTROS PCT: 76 % (ref 43–77)
Neutro Abs: 10.6 10*3/uL — ABNORMAL HIGH (ref 1.7–7.7)
PLATELETS: 241 10*3/uL (ref 150–400)
RBC: 4.66 MIL/uL (ref 4.22–5.81)
RDW: 15.1 % (ref 11.5–15.5)
WBC: 14 10*3/uL — ABNORMAL HIGH (ref 4.0–10.5)

## 2013-10-12 LAB — INFLUENZA PANEL BY PCR (TYPE A & B)
H1N1 flu by pcr: NOT DETECTED
INFLAPCR: NEGATIVE
Influenza B By PCR: NEGATIVE

## 2013-10-12 LAB — SEDIMENTATION RATE: Sed Rate: 38 mm/hr — ABNORMAL HIGH (ref 0–16)

## 2013-10-12 LAB — CG4 I-STAT (LACTIC ACID): Lactic Acid, Venous: 1.12 mmol/L (ref 0.5–2.2)

## 2013-10-12 LAB — C-REACTIVE PROTEIN: CRP: 4.2 mg/dL — ABNORMAL HIGH (ref <0.60)

## 2013-10-12 MED ORDER — ACETAMINOPHEN 325 MG PO TABS
650.0000 mg | ORAL_TABLET | Freq: Four times a day (QID) | ORAL | Status: DC | PRN
  Administered 2013-10-13: 650 mg via ORAL
  Filled 2013-10-12: qty 2

## 2013-10-12 MED ORDER — SODIUM CHLORIDE 0.9 % IV BOLUS (SEPSIS)
1000.0000 mL | Freq: Once | INTRAVENOUS | Status: AC
  Administered 2013-10-12: 1000 mL via INTRAVENOUS

## 2013-10-12 MED ORDER — HEPARIN SODIUM (PORCINE) 5000 UNIT/ML IJ SOLN
5000.0000 [IU] | Freq: Three times a day (TID) | INTRAMUSCULAR | Status: DC
  Administered 2013-10-12 – 2013-10-13 (×5): 5000 [IU] via SUBCUTANEOUS
  Filled 2013-10-12 (×7): qty 1

## 2013-10-12 MED ORDER — METOPROLOL TARTRATE 100 MG PO TABS
100.0000 mg | ORAL_TABLET | Freq: Two times a day (BID) | ORAL | Status: DC
  Administered 2013-10-12 – 2013-10-13 (×3): 100 mg via ORAL
  Filled 2013-10-12 (×4): qty 1

## 2013-10-12 MED ORDER — SODIUM CHLORIDE 0.9 % IV SOLN
2500.0000 mg | Freq: Once | INTRAVENOUS | Status: AC
  Administered 2013-10-12: 2500 mg via INTRAVENOUS
  Filled 2013-10-12: qty 2500

## 2013-10-12 MED ORDER — ONDANSETRON HCL 4 MG PO TABS
4.0000 mg | ORAL_TABLET | Freq: Four times a day (QID) | ORAL | Status: DC | PRN
Start: 2013-10-12 — End: 2013-10-13

## 2013-10-12 MED ORDER — CLINDAMYCIN PHOSPHATE 600 MG/50ML IV SOLN
600.0000 mg | Freq: Once | INTRAVENOUS | Status: AC
  Administered 2013-10-12: 600 mg via INTRAVENOUS
  Filled 2013-10-12: qty 50

## 2013-10-12 MED ORDER — ACETAMINOPHEN 650 MG RE SUPP: 650.0000 mg | Freq: Four times a day (QID) | RECTAL | Status: DC | PRN

## 2013-10-12 MED ORDER — VANCOMYCIN HCL IN DEXTROSE 1-5 GM/200ML-% IV SOLN
1000.0000 mg | Freq: Three times a day (TID) | INTRAVENOUS | Status: DC
  Administered 2013-10-12 – 2013-10-13 (×4): 1000 mg via INTRAVENOUS
  Filled 2013-10-12 (×6): qty 200

## 2013-10-12 MED ORDER — ASPIRIN 325 MG PO TABS
650.0000 mg | ORAL_TABLET | Freq: Every day | ORAL | Status: DC
  Administered 2013-10-12 – 2013-10-13 (×2): 650 mg via ORAL
  Filled 2013-10-12 (×2): qty 2

## 2013-10-12 MED ORDER — ONDANSETRON HCL 4 MG/2ML IJ SOLN: 4.0000 mg | Freq: Four times a day (QID) | INTRAMUSCULAR | Status: DC | PRN

## 2013-10-12 NOTE — ED Provider Notes (Signed)
Medical screening examination/treatment/procedure(s) were performed by non-physician practitioner and as supervising physician I was immediately available for consultation/collaboration.   Vada Yellen, MD 10/12/13 0746 

## 2013-10-12 NOTE — ED Provider Notes (Signed)
CSN: [id number]     Arrival date & time 10/11/13  2057 History   First MD Initiated Contact with Patient 10/12/13 0015     Chief Complaint  Patient presents with  . Cellulitis   (Consider location/radiation/quality/duration/timing/severity/associated sxs/prior Treatment) HPI Comments: Patient's 53 year old male history of paroxysmal atrial fibrillation, currently on aspirin therapy only, who presents to the emergency department for worsening redness and swelling of his right lower extremity. Patient states that he noticed some scratches on his right leg around Christmas. He states that he has been trying conservative wound care at home for symptoms without relief. In the past 24 hours. He states that he has noticed a redness developing in his right lower extremity. Symptoms also associated with clear serous weeping from the wound sites. Patient endorses some mild pain to the area which is aching in nature nonradiating. Pain is worse with palpation. He also states he had some chills this morning, but was not febrile. He took his temperature and found it to be 99.29F orally. Patient denies associated numbness/tingling, extremity weakness, pallor, and inability to ambulate. Patient has not had any issues of atrial fibrillation in the last 2 years. He denies any associated syncope, near syncope, shortness of breath, chest pain or palpitations, and nausea or vomiting. He denies a hx of abscesses or MRSA.  The history is provided by the patient. No language interpreter was used.    Past Medical History  Diagnosis Date  . Hypertension   . Knee pain, bilateral   . Obesity   . Gout   . PAF (paroxysmal atrial fibrillation) Dec 2012  . S/P cardiac cath Dec 2012    Normal coronaries per cath   Past Surgical History  Procedure Laterality Date  . Cardiac catheterization  Dec 2012    Normal.   Family History  Problem Relation Age of Onset  . Heart disease Mother     Afib  . Heart disease Father    CABG X 3 (05), HTN, elevated cholesterol, TKR  . Hypertension Father   . Hyperlipidemia Father   . Hypertension Sister   . Cancer Maternal Grandmother     Ovarian cancer  . Stroke Maternal Grandfather   . Diabetes Neg Hx   . Depression Neg Hx   . Alcohol abuse Neg Hx   . Drug abuse Neg Hx   . Prostate cancer Neg Hx   . Colon cancer Neg Hx    History  Substance Use Topics  . Smoking status: Never Smoker   . Smokeless tobacco: Never Used  . Alcohol Use: No    Review of Systems  Constitutional: Positive for chills. Negative for fever.  Respiratory: Negative for shortness of breath.   Cardiovascular: Positive for leg swelling (RLE). Negative for chest pain and palpitations.  Gastrointestinal: Negative for nausea, vomiting and abdominal pain.  Musculoskeletal: Positive for myalgias. Negative for gait problem.  Skin: Positive for color change and wound. Negative for pallor.  Neurological: Negative for weakness and numbness.  All other systems reviewed and are negative.    Allergies  Review of patient's allergies indicates no known allergies.  Home Medications   Current Outpatient Rx  Name  Route  Sig  Dispense  Refill  . aspirin 325 MG tablet   Oral   Take 650 mg by mouth daily.         . hydrocortisone cream 0.5 %   Topical   Apply 1 application topically daily as needed for itching.         .Marland Kitchen  metoprolol (LOPRESSOR) 100 MG tablet   Oral   Take 100 mg by mouth 2 (two) times daily.         . Multiple Vitamin (MULTIVITAMIN) tablet   Oral   Take 1 tablet by mouth daily.            BP 106/50  Pulse 77  Temp(Src) 99.1 F (37.3 C) (Rectal)  Resp 16  Ht 5' 10.08" (1.78 m)  Wt 493 lb (223.623 kg)  BMI 70.58 kg/m2  SpO2 97%  Physical Exam  Nursing note and vitals reviewed. Constitutional: He is oriented to person, place, and time. He appears well-developed and well-nourished. No distress.  HENT:  Head: Normocephalic and atraumatic.  Mouth/Throat:  Oropharynx is clear and moist. No oropharyngeal exudate.  Eyes: Conjunctivae and EOM are normal. No scleral icterus.  Neck: Normal range of motion.  Cardiovascular: Normal rate, regular rhythm, normal heart sounds and intact distal pulses.   Patient not tachycardic as noted in triage. Dorsalis pedis and posterior tibial pulses 2+ bilaterally.  Pulmonary/Chest: Effort normal. No respiratory distress. He has no wheezes. He has no rales.  Musculoskeletal: Normal range of motion.       Right lower leg: He exhibits tenderness, swelling and edema (1+ pitting). He exhibits no bony tenderness, no deformity and no laceration.       Legs: Erythema, heat to touch, and swelling to right lower extremity consistent with cellulitic process. No red linear streaking appreciated. Mild tenderness to palpation. There also weeping patches noted on patient's anterior lateral right lower extremity and posterior lateral right lower extremity. Anterior patch approximately 4 cm x 7 cm and posterior patch approximately 1.5 cm x 4 cm. Areas weeping clear serous fluid.  Neurological: He is alert and oriented to person, place, and time. He has normal reflexes. He exhibits normal muscle tone.  No gross sensory deficits appreciated in bilateral lower extremities. Patellar and Achilles reflexes 2+ bilaterally.  Skin: Skin is warm and dry. No rash noted. He is not diaphoretic. There is erythema. No pallor.  Psychiatric: He has a normal mood and affect. His behavior is normal.    ED Course  Procedures (including critical care time) Labs Review Labs Reviewed  CBC WITH DIFFERENTIAL - Abnormal; Notable for the following:    WBC 14.0 (*)    Hemoglobin 12.6 (*)    HCT 38.3 (*)    Neutro Abs 10.6 (*)    All other components within normal limits  BASIC METABOLIC PANEL - Abnormal; Notable for the following:    Sodium 136 (*)    Glucose, Bld 114 (*)    All other components within normal limits  SEDIMENTATION RATE - Abnormal;  Notable for the following:    Sed Rate 38 (*)    All other components within normal limits  CULTURE, BLOOD (ROUTINE X 2)  CULTURE, BLOOD (ROUTINE X 2)  C-REACTIVE PROTEIN  CG4 I-STAT (LACTIC ACID)   Imaging Review Dg Tibia/fibula Right  10/12/2013   CLINICAL DATA:  Cellulitis.  EXAM: RIGHT TIBIA AND FIBULA - 2 VIEW  COMPARISON:  None available for comparison at time of study interpretation.  FINDINGS: Diffuse soft tissue swelling without subcutaneous gas or radiopaque foreign bodies.  No acute fracture deformity. No dislocation. No destructive bony lesions. Moderate to severe degenerative change of the knee partially imaged.  IMPRESSION: Diffuse soft tissue swelling without subcutaneous gas or destructive bony lesions.  No acute fracture deformity or dislocation.   Electronically Signed   By: Pernell Dupre  Bloomer   On: 10/12/2013 02:05    EKG Interpretation   None       MDM   1. Cellulitis    53 year old male presents for worsening cellulitis to his right lower extremity. Symptoms have been worsening since Christmas, but severely worsened overnight. Patient is neurovascularly intact. He has tenderness to cellulitic area which extends from below his right knee to his right ankle. There are two areas weeping clear serous fluid. No gross sensory deficits appreciated.  Patient is afebrile, but tachycardic on arrival. This has improved over ED course with IV fluids. Patient has been treated with dose of clindamycin for cellulitis. He denies a history of MRSA. X-ray negative for subcutaneous gas or destructive bony changes. Given rapid worsening of symptoms, however, believe patient warrants obs admission for monitoring and subsequent doses of IV antibiotics. Have spoken to Dr. Allena Katz of [facility] who will admit.    Antony Madura, PA-C 10/12/13 [phone]

## 2013-10-12 NOTE — H&P (Signed)
[facility] History and Physical  Patient: Benjamin Patterson  URK:[id number]  DOB: Feb 09, 1961  DOS: the patient was seen and examined on 10/12/2013 PCP: Elsie Stain, MD  Chief Complaint: Bilateral leg redness  HPI: Benjamin Patterson is a 53 y.o. male with Past medical history of hypertension, morbid obesity, chronic venous stasis. The patient is coming from home. Patient mentions that he has bilateral leg swelling that has been ongoing since last few months. In December he had an injury to his right leg. Since last few weeks he has noted a blistering on his right leg at the injury site. He also has some pain on walking. Last night he started having generalized weakness with fever and chills. He noted last night that his legs have been reddening and more swollen. Therefore he came to the ED. Pt denies any fever, chills, headache, cough, chest pain, palpitation, shortness of breath, orthopnea, PND, nausea, vomiting, abdominal pain, diarrhea, constipation, active bleeding, burning urination, dizziness, pedal edema,  focal neurological deficit.   Review of Systems: as mentioned in the history of present illness.  A Comprehensive review of the other systems is negative.  Past Medical History  Diagnosis Date  . Hypertension   . Knee pain, bilateral   . Obesity   . Gout   . PAF (paroxysmal atrial fibrillation) Dec 2012  . S/P cardiac cath Dec 2012    Normal coronaries per cath   Past Surgical History  Procedure Laterality Date  . Cardiac catheterization  Dec 2012    Normal.   Social History:  reports that he has never smoked. He has never used smokeless tobacco. He reports that he does not drink alcohol or use illicit drugs. Independent for most of his  ADL.  No Known Allergies  Family History  Problem Relation Age of Onset  . Heart disease Mother     Afib  . Heart disease Father     CABG X 3 (05), HTN, elevated cholesterol, TKR  . Hypertension Father   . Hyperlipidemia Father    . Hypertension Sister   . Cancer Maternal Grandmother     Ovarian cancer  . Stroke Maternal Grandfather   . Diabetes Neg Hx   . Depression Neg Hx   . Alcohol abuse Neg Hx   . Drug abuse Neg Hx   . Prostate cancer Neg Hx   . Colon cancer Neg Hx     Prior to Admission medications   Medication Sig Start Date End Date Taking? Authorizing Provider  aspirin 325 MG tablet Take 650 mg by mouth daily.   Yes Historical Provider, MD  hydrocortisone cream 0.5 % Apply 1 application topically daily as needed for itching.   Yes Historical Provider, MD  metoprolol (LOPRESSOR) 100 MG tablet Take 100 mg by mouth 2 (two) times daily.   Yes Historical Provider, MD  Multiple Vitamin (MULTIVITAMIN) tablet Take 1 tablet by mouth daily.     Yes Historical Provider, MD    Physical Exam: Filed Vitals:   10/12/13 8315 10/12/13 0500 10/12/13 0526 10/12/13 0548  BP: 106/50   114/50  Pulse: 77   79  Temp: 98.9 F (37.2 C)  99.1 F (37.3 C) 98.1 F (36.7 C)  TempSrc: Oral  Rectal Oral  Resp: 16   20  Height:  5' 10.08" (1.78 m)  5' 10"  (1.778 m)  Weight:    212.5 kg (468 lb 7.6 oz)  SpO2: 97%   100%    General: Alert, Awake and  Oriented to Time, Place and Person. Appear in mild distress Eyes: PERRL ENT: Oral Mucosa clear moist. Neck: no JVD Cardiovascular: S1 and S2 Present, no Murmur, Peripheral Pulses Present Respiratory: Bilateral Air entry equal and Decreased, Clear to Auscultation,  no Crackles,no wheezes Abdomen: Bowel Sound Present, Soft and Non tender Skin: no Rash Extremities: Bilateral Pedal edema, no calf tenderness, redness involving bilateral extremities, this is a blister with sloughing on the right leg, as is also on the right calf with sloughing Neurologic: Grossly Unremarkable. Labs on Admission:  CBC:  Recent Labs Lab 10/12/13 0030  WBC 14.0*  NEUTROABS 10.6*  HGB 12.6*  HCT 38.3*  MCV 82.2  PLT 241    CMP     Component Value Date/Time   NA 136* 10/12/2013 0030   K  3.8 10/12/2013 0030   CL 98 10/12/2013 0030   CO2 22 10/12/2013 0030   GLUCOSE 114* 10/12/2013 0030   BUN 12 10/12/2013 0030   CREATININE 0.97 10/12/2013 0030   CALCIUM 9.0 10/12/2013 0030   PROT 8.1 06/16/2013 1238   ALBUMIN 4.0 06/16/2013 1238   AST 25 06/16/2013 1238   ALT 27 06/16/2013 1238   ALKPHOS 57 06/16/2013 1238   BILITOT 0.7 06/16/2013 1238   GFRNONAA >90 10/12/2013 0030   GFRAA >90 10/12/2013 0030   Radiological Exams on Admission: Dg Tibia/fibula Right  10/12/2013   CLINICAL DATA:  Cellulitis.  EXAM: RIGHT TIBIA AND FIBULA - 2 VIEW  COMPARISON:  None available for comparison at time of study interpretation.  FINDINGS: Diffuse soft tissue swelling without subcutaneous gas or radiopaque foreign bodies.  No acute fracture deformity. No dislocation. No destructive bony lesions. Moderate to severe degenerative change of the knee partially imaged.  IMPRESSION: Diffuse soft tissue swelling without subcutaneous gas or destructive bony lesions.  No acute fracture deformity or dislocation.   Electronically Signed   By: Elon Alas   On: 10/12/2013 02:05     Assessment/Plan Principal Problem:   Cellulitis Active Problems:   Morbid obesity   HYPERTENSION   PAF (paroxysmal atrial fibrillation)   1. Cellulitis The patient is presenting with complains of bilateral leg swelling with redness and pain. He is able to ambulate and he has been ambulating at home. He does not have any leukocytosis he has not been on any antibiotics but his symptoms have been progressively worsening along with that he has a blister which has been present since last few weeks with some redness. With his elevated ESR and is concerned that he has long-standing infection and therefore I will start him IV vancomycin and admit him to the hospital for observation for resolution. he has some pain in his right leg on ambulation I would also scan his legs for DVT  2. Hypertension Continue metoprolol and aspirin   DVT Prophylaxis:  subcutaneous Heparin Nutrition: Cardiac diet  Code Status: Full  Family Communication: Wife was present at bedside, opportunity was given to ask question and all questions were answered satisfactorily at the time of interview. Disposition: Admitted to observation in telemetry unit.  Author: Berle Mull, MD [facility] Pager: [phone] 10/12/2013, 6:07 AM    If 7PM-7AM, please contact night-coverage [email or web address] Password [email or web address]

## 2013-10-12 NOTE — Progress Notes (Signed)
Utilization review completed.  

## 2013-10-12 NOTE — Progress Notes (Signed)
Patient seen and examined. Admitted after midnight with RLE redness, swelling and pain. Patient also with leokocytosis. Physical exam and findings suggesting cellulitis. Please referred to Dr. Eliane DecreePatel's HPI note for further info/details on admission.  Plan: -continue IV antibiotics -follow Le dopplers  -will follow clinical response -PRN pain meds and leg elevation  Susy Placzek [phone]

## 2013-10-12 NOTE — Progress Notes (Signed)
ANTIBIOTIC CONSULT NOTE - INITIAL  Pharmacy Consult for vancomycin Indication: cellulitis  No Known Allergies  Patient Measurements: Height: 5' 10.08" (178 cm) Weight: 493 lb (223.623 kg) IBW/kg (Calculated) : 73.18  Vital Signs: Temp: 98.9 F (37.2 C) (02/01 0212) Temp src: Oral (02/01 0212) BP: 106/50 mmHg (02/01 0212) Pulse Rate: 77 (02/01 0212) Intake/Output from previous day: 01/31 0701 - 02/01 0700 In: 1050 [I.V.:1050] Out: 775 [Urine:775] Intake/Output from this shift: Total I/O In: 1050 [I.V.:1050] Out: 775 [Urine:775]  Labs:  Recent Labs  10/12/13 0030  WBC 14.0*  HGB 12.6*  PLT 241  CREATININE 0.97   Estimated Creatinine Clearance: 164.3 ml/min (by C-G formula based on Cr of 0.97).   Microbiology: No results found for this or any previous visit (from the past 720 hour(s)).  Medical History: Past Medical History  Diagnosis Date  . Hypertension   . Knee pain, bilateral   . Obesity   . Gout   . PAF (paroxysmal atrial fibrillation) Dec 2012  . S/P cardiac cath Dec 2012    Normal coronaries per cath    Assessment: 53yo male c/o non-healing "scratches" since Christmas, now w/ BLE swelling and weeping of RLE, to begin IV ABX for cellulitis.  Goal of Therapy:  Vancomycin trough level 10-15 mcg/ml  Plan:  Will give vancomycin 2500mg  IV x1 now then begin 1000mg  IV Q8H and monitor CBC, Cx, levels prn.  Vernard GamblesVeronda Broadus Costilla, PharmD, BCPS  10/12/2013,5:21 AM

## 2013-10-13 DIAGNOSIS — I10 Essential (primary) hypertension: Secondary | ICD-10-CM

## 2013-10-13 LAB — BASIC METABOLIC PANEL
BUN: 9 mg/dL (ref 6–23)
CALCIUM: 8.7 mg/dL (ref 8.4–10.5)
CO2: 24 mEq/L (ref 19–32)
CREATININE: 0.78 mg/dL (ref 0.50–1.35)
Chloride: 105 mEq/L (ref 96–112)
Glucose, Bld: 104 mg/dL — ABNORMAL HIGH (ref 70–99)
Potassium: 4.1 mEq/L (ref 3.7–5.3)
Sodium: 142 mEq/L (ref 137–147)

## 2013-10-13 LAB — CBC
HEMATOCRIT: 38.7 % — AB (ref 39.0–52.0)
Hemoglobin: 12.4 g/dL — ABNORMAL LOW (ref 13.0–17.0)
MCH: 26.7 pg (ref 26.0–34.0)
MCHC: 32 g/dL (ref 30.0–36.0)
MCV: 83.2 fL (ref 78.0–100.0)
PLATELETS: 229 10*3/uL (ref 150–400)
RBC: 4.65 MIL/uL (ref 4.22–5.81)
RDW: 15.5 % (ref 11.5–15.5)
WBC: 7.5 10*3/uL (ref 4.0–10.5)

## 2013-10-13 LAB — VANCOMYCIN, TROUGH

## 2013-10-13 MED ORDER — VANCOMYCIN HCL 10 G IV SOLR
1750.0000 mg | Freq: Two times a day (BID) | INTRAVENOUS | Status: DC
  Filled 2013-10-13: qty 1750

## 2013-10-13 MED ORDER — LORATADINE 10 MG PO TABS: 10.0000 mg | ORAL_TABLET | Freq: Every day | ORAL | Status: DC

## 2013-10-13 MED ORDER — VANCOMYCIN HCL 10 G IV SOLR
1500.0000 mg | Freq: Once | INTRAVENOUS | Status: DC
  Filled 2013-10-13: qty 1500

## 2013-10-13 MED ORDER — TRAMADOL HCL 50 MG PO TABS: 50.0000 mg | ORAL_TABLET | Freq: Four times a day (QID) | ORAL | Status: DC | PRN

## 2013-10-13 MED ORDER — LORATADINE 10 MG PO TABS
10.0000 mg | ORAL_TABLET | Freq: Every day | ORAL | Status: DC
  Administered 2013-10-13: 10 mg via ORAL
  Filled 2013-10-13: qty 1

## 2013-10-13 MED ORDER — CLINDAMYCIN HCL 300 MG PO CAPS: 300.0000 mg | ORAL_CAPSULE | Freq: Three times a day (TID) | ORAL | Status: AC

## 2013-10-13 NOTE — Consult Note (Signed)
WOC wound consult note Reason for Consult: LE wound, history of trauma to the RLE. He has significant edema but bilaterally warm extremities and palpable pulses.  Feel his ulcerations are related to venus stasis dx. However with dc pending today will have complete work up in wound care center. Appt. Made for Tuesday 10/21/13 8:15am I have made the patient aware of the appt.  Wound type Venous stasis ulcerations Measurement: R lateral: 5cm x 9cm x 0.2cm  R posterior: 4cm x 1.0cm x 0.2cm  Wound ZOX:WRUEbed:pink, moist, the right lateral is hypergranulated. Weeping skin over the entire RLE.  LLE is intact with edema Drainage (amount, consistency, odor) serous Periwound: resolving cellulitis Dressing procedure/placement/frequency: Silver hydrofiber to absorb excess exudate, cover with foam. Wrap with kerlix and ACE.  Work up for venous staisis, compression wraps and long term compression stockings to be done on outpatient basis.   Discussed POC with patient and bedside nurse.  Re consult if needed, will not follow at this time. Thanks  Deari Sessler [employer] RN, CWOCN [phone])

## 2013-10-13 NOTE — Discharge Summary (Signed)
Physician Discharge Summary  Benjamin Patterson ZOX:[id number] DOB: [birth year] DOA: 10/11/2013  PCP: Crawford Givens, MD  Admit date: 10/11/2013 Discharge date: 10/13/2013  Time spent: >30 minutes  Recommendations for Outpatient Follow-up:  1. BMET to follow electrolytes and renal function 2. Reassess RLE wound/cellulitis.  Discharge Diagnoses:  Principal Problem:   Cellulitis Active Problems:   Morbid obesity   HYPERTENSION   PAF (paroxysmal atrial fibrillation)   Discharge Condition: stable and improved. Will discharge home with instructions to follow at wound care center and also with PCP in 2 weeks  Diet recommendation: low calorie and heart healthy diet   Filed Weights   10/11/13 2104 10/12/13 0548  Weight: 223.623 kg (493 lb) 212.5 kg (468 lb 7.6 oz)    History of present illness:  53 y.o. male with Past medical history of hypertension, morbid obesity, chronic venous stasis.  The patient is coming from home.  Patient mentions that he has bilateral leg swelling that has been ongoing since last few months. In December he had an injury to his right leg. Since last few weeks he has noted a blistering on his right leg at the injury site. He also has some pain on walking. Last night he started having generalized weakness with fever and chills. He noted last night that his legs have been reddening and more swollen. Therefore he came to the ED.  Pt denies any fever, chills, headache, cough, chest pain, palpitation, shortness of breath, orthopnea, PND, nausea, vomiting, abdominal pain, diarrhea, constipation, active bleeding, burning urination, dizziness, pedal edema, focal neurological deficit.    Hospital Course:  1-RLE cellulitis: improving. Less redness; patient also reports not significant pain and weeping wounds w/o malodorus drainage, pink base and hypergranulated appearance. -no fever and normal WBC's -will discharge on cleocin TID -Wound care instructions for home and follow up  at wound care center for further eval/treatemnt -follow up with PCP in 2 weeks  2-PAF: continue metoprolol and ASA  3-HTN: continue home medication regimen and low sodium diet  4- Gout: not acute flare. Follow up with PCP  5-morbid obesity: low calorie diet and exercise discussed and encourage.  Procedures:  LE duplex: negative DVT, SVT and/or baker's cyst  Consultations:  Wound care service   Discharge Exam: Filed Vitals:   10/13/13 1426  BP: 152/88  Pulse: 65  Temp: 97.9 F (36.6 C)  Resp: 18    General: NAD, afebrile; complaining of mild HA and runny nose Cardiovascular: S1 And S2, regular rate, no rubs or gallops Respiratory: CTA bilaterally Abdomen: soft, NT, ND, positive BS Extremities: RLE with improved redness and pain; lateral skin with hypergranulated wound; wound bed is pink, no malodorus/serous drainage. Neuro: non focal  Discharge Instructions  Discharge Orders   Future Orders Complete By Expires   Diet - low sodium heart healthy  As directed    Discharge instructions  As directed    Comments:     Take medications as prescribed Cover right lateral LE wound and posterior wound with silver Hydrofiber large foam dressing, wrap with kerlix, and 4" ACE wrap.  Change M/W/F Arrange follow up with PCP in 1 week Follow with wound care clinic as instructed Please maintain RLE elevated as much as possible       Medication List    STOP taking these medications       hydrocortisone cream 0.5 %      TAKE these medications       aspirin 325 MG tablet  Take 650  mg by mouth daily.     clindamycin 300 MG capsule  Commonly known as:  CLEOCIN  Take 1 capsule (300 mg total) by mouth 3 (three) times daily.     loratadine 10 MG tablet  Commonly known as:  CLARITIN  Take 1 tablet (10 mg total) by mouth daily.     metoprolol 100 MG tablet  Commonly known as:  LOPRESSOR  Take 100 mg by mouth 2 (two) times daily.     multivitamin tablet  Take 1 tablet by  mouth daily.       No Known Allergies     Follow-up Information   Follow up with Crawford GivensGraham Duncan, MD In 2 weeks.   Specialty:  Family Medicine   Contact information:   [address] [place] [address] [phone]        The results of significant diagnostics from this hospitalization (including imaging, microbiology, ancillary and laboratory) are listed below for reference.    Significant Diagnostic Studies: Dg Tibia/fibula Right  10/12/2013   CLINICAL DATA:  Cellulitis.  EXAM: RIGHT TIBIA AND FIBULA - 2 VIEW  COMPARISON:  None available for comparison at time of study interpretation.  FINDINGS: Diffuse soft tissue swelling without subcutaneous gas or radiopaque foreign bodies.  No acute fracture deformity. No dislocation. No destructive bony lesions. Moderate to severe degenerative change of the knee partially imaged.  IMPRESSION: Diffuse soft tissue swelling without subcutaneous gas or destructive bony lesions.  No acute fracture deformity or dislocation.   Electronically Signed   By: Awilda Metroourtnay  Bloomer   On: 10/12/2013 02:05    Microbiology: Recent Results (from the past 240 hour(s))  CULTURE, BLOOD (ROUTINE X 2)     Status: None   Collection Time    10/12/13 12:40 AM      Result Value Range Status   Specimen Description BLOOD LEFT ARM   Final   Special Requests BOTTLES DRAWN AEROBIC AND ANAEROBIC 10CC EA   Final   Culture  Setup Time     Final   Value: 10/12/2013 13:57     Performed at [facility]   Culture     Final   Value:        BLOOD CULTURE RECEIVED NO GROWTH TO DATE CULTURE WILL BE HELD FOR 5 DAYS BEFORE ISSUING A FINAL NEGATIVE REPORT     Performed at [facility]   Report Status PENDING   Incomplete  CULTURE, BLOOD (ROUTINE X 2)     Status: None   Collection Time    10/12/13 12:45 AM      Result Value Range Status   Specimen Description BLOOD RIGHT ANTECUBITAL   Final   Special Requests BOTTLES DRAWN AEROBIC ONLY 10CCS   Final    Culture  Setup Time     Final   Value: 10/12/2013 13:57     Performed at [facility]   Culture     Final   Value:        BLOOD CULTURE RECEIVED NO GROWTH TO DATE CULTURE WILL BE HELD FOR 5 DAYS BEFORE ISSUING A FINAL NEGATIVE REPORT     Performed at [facility]   Report Status PENDING   Incomplete     Labs: Basic Metabolic Panel:  Recent Labs Lab 10/12/13 0030 10/13/13 0655  NA 136* 142  K 3.8 4.1  CL 98 105  CO2 22 24  GLUCOSE 114* 104*  BUN 12 9  CREATININE 0.97 0.78  CALCIUM 9.0  8.7   CBC:  Recent Labs Lab 10/12/13 0030 10/13/13 0655  WBC 14.0* 7.5  NEUTROABS 10.6*  --   HGB 12.6* 12.4*  HCT 38.3* 38.7*  MCV 82.2 83.2  PLT 241 229    Signed:  Rhapsody Wolven  [facility] 10/13/2013, 3:45 PM

## 2013-10-13 NOTE — Discharge Instructions (Signed)
Cellulitis Cellulitis is an infection of the skin and the tissue beneath it. The infected area is usually red and tender. Cellulitis occurs most often in the arms and lower legs.  CAUSES  Cellulitis is caused by bacteria that enter the skin through cracks or cuts in the skin. The most common types of bacteria that cause cellulitis are Staphylococcus and Streptococcus. SYMPTOMS   Redness and warmth.  Swelling.  Tenderness or pain.  Fever. DIAGNOSIS  Your caregiver can usually determine what is wrong based on a physical exam. Blood tests may also be done. TREATMENT  Treatment usually involves taking an antibiotic medicine. HOME CARE INSTRUCTIONS   Take your antibiotics as directed. Finish them even if you start to feel better.  Keep the infected arm or leg elevated to reduce swelling.  Apply a warm cloth to the affected area up to 4 times per day to relieve pain.  Only take over-the-counter or prescription medicines for pain, discomfort, or fever as directed by your caregiver.  Keep all follow-up appointments as directed by your caregiver. SEEK MEDICAL CARE IF:   You notice red streaks coming from the infected area.  Your red area gets larger or turns dark in color.  Your bone or joint underneath the infected area becomes painful after the skin has healed.  Your infection returns in the same area or another area.  You notice a swollen bump in the infected area.  You develop new symptoms. SEEK IMMEDIATE MEDICAL CARE IF:   You have a fever.  You feel very sleepy.  You develop vomiting or diarrhea.  You have a general ill feeling (malaise) with muscle aches and pains. MAKE SURE YOU:   Understand these instructions.  Will watch your condition.  Will get help right away if you are not doing well or get worse. Document Released: 06/07/2005 Document Revised: 02/27/2012 Document Reviewed: 11/13/2011 ExitCare Patient Information 2014 [employer].  

## 2013-10-13 NOTE — Progress Notes (Signed)
ANTIBIOTIC CONSULT NOTE - INITIAL  Pharmacy Consult for vancomycin Indication: cellulitis  No Known Allergies  Patient Measurements: Height: 5\' 10"  (177.8 cm) Weight: 468 lb 7.6 oz (212.5 kg) IBW/kg (Calculated) : 73  Vital Signs: Temp: 97.9 F (36.6 C) (02/02 1426) Temp src: Oral (02/02 0545) BP: 152/88 mmHg (02/02 1426) Pulse Rate: 65 (02/02 1426) Intake/Output from previous day: 02/01 0701 - 02/02 0700 In: 920 [P.O.:720; IV Piggyback:200] Out: -  Intake/Output from this shift: Total I/O In: 440 [P.O.:440] Out: -   Labs:  Recent Labs  10/12/13 0030 10/13/13 0655  WBC 14.0* 7.5  HGB 12.6* 12.4*  PLT 241 229  CREATININE 0.97 0.78   Estimated Creatinine Clearance: 192.3 ml/min (by C-G formula based on Cr of 0.78).   Microbiology: Recent Results (from the past 720 hour(s))  CULTURE, BLOOD (ROUTINE X 2)     Status: None   Collection Time    10/12/13 12:40 AM      Result Value Range Status   Specimen Description BLOOD LEFT ARM   Final   Special Requests BOTTLES DRAWN AEROBIC AND ANAEROBIC 10CC EA   Final   Culture  Setup Time     Final   Value: 10/12/2013 13:57     Performed at [facility]   Culture     Final   Value:        BLOOD CULTURE RECEIVED NO GROWTH TO DATE CULTURE WILL BE HELD FOR 5 DAYS BEFORE ISSUING A FINAL NEGATIVE REPORT     Performed at [facility]   Report Status PENDING   Incomplete  CULTURE, BLOOD (ROUTINE X 2)     Status: None   Collection Time    10/12/13 12:45 AM      Result Value Range Status   Specimen Description BLOOD RIGHT ANTECUBITAL   Final   Special Requests BOTTLES DRAWN AEROBIC ONLY 10CCS   Final   Culture  Setup Time     Final   Value: 10/12/2013 13:57     Performed at [facility]   Culture     Final   Value:        BLOOD CULTURE RECEIVED NO GROWTH TO DATE CULTURE WILL BE HELD FOR 5 DAYS BEFORE ISSUING A FINAL NEGATIVE REPORT     Performed at [facility]   Report Status PENDING    Incomplete    Medical History: Past Medical History  Diagnosis Date  . Hypertension   . Knee pain, bilateral   . Obesity   . Gout   . PAF (paroxysmal atrial fibrillation) Dec 2012  . S/P cardiac cath Dec 2012    Normal coronaries per cath    Assessment: 53yo male c/o non-healing "scratches" since Christmas, now w/ BLE swelling and weeping of RLE. Vanc trough came back today subtherapeutic likely due to wt. Will adjust dose.   Goal of Therapy:  Vancomycin trough level 10-15 mcg/ml  Plan:   Additional vanc 1.5g IV x1 then change dose to 1.75g IV q12

## 2013-10-13 NOTE — Progress Notes (Signed)
Bilateral lower extremity venous duplex:  No obvious evidence of DVT, superficial thrombosis, or Baker's Cyst.  Technically difficult study due to the patient's body habitus.   

## 2013-10-14 NOTE — Progress Notes (Signed)
Patient discharged.  Patient educated on discharge instructions, follow-up appointments, and discharge medications.  Demonstrated wound care and dressing changes for patient and wife, to which patient and wife verbalized understanding.  Patient educated on cellulitis, signs and symptoms of worsening infection, and when to call a doctor.  Belongings gathered.  IV removed.  Patient escorted downstairs via wheelchair.

## 2013-10-18 LAB — CULTURE, BLOOD (ROUTINE X 2)
CULTURE: NO GROWTH
Culture: NO GROWTH

## 2013-10-21 ENCOUNTER — Encounter: Payer: Self-pay | Admitting: Family Medicine

## 2013-10-21 ENCOUNTER — Ambulatory Visit (INDEPENDENT_AMBULATORY_CARE_PROVIDER_SITE_OTHER): Payer: 59 | Admitting: Family Medicine

## 2013-10-21 ENCOUNTER — Encounter (HOSPITAL_BASED_OUTPATIENT_CLINIC_OR_DEPARTMENT_OTHER): Payer: 59 | Attending: General Surgery

## 2013-10-21 VITALS — BP 142/94 | HR 60 | Temp 98.0°F | Wt >= 6400 oz

## 2013-10-21 DIAGNOSIS — Z79899 Other long term (current) drug therapy: Secondary | ICD-10-CM | POA: Insufficient documentation

## 2013-10-21 DIAGNOSIS — I87339 Chronic venous hypertension (idiopathic) with ulcer and inflammation of unspecified lower extremity: Secondary | ICD-10-CM | POA: Insufficient documentation

## 2013-10-21 DIAGNOSIS — L97909 Non-pressure chronic ulcer of unspecified part of unspecified lower leg with unspecified severity: Principal | ICD-10-CM

## 2013-10-21 DIAGNOSIS — I10 Essential (primary) hypertension: Secondary | ICD-10-CM | POA: Diagnosis not present

## 2013-10-21 DIAGNOSIS — L0390 Cellulitis, unspecified: Secondary | ICD-10-CM

## 2013-10-21 DIAGNOSIS — I739 Peripheral vascular disease, unspecified: Secondary | ICD-10-CM | POA: Diagnosis not present

## 2013-10-21 DIAGNOSIS — Z7982 Long term (current) use of aspirin: Secondary | ICD-10-CM | POA: Insufficient documentation

## 2013-10-21 DIAGNOSIS — L0291 Cutaneous abscess, unspecified: Secondary | ICD-10-CM

## 2013-10-21 DIAGNOSIS — L97809 Non-pressure chronic ulcer of other part of unspecified lower leg with unspecified severity: Secondary | ICD-10-CM | POA: Diagnosis not present

## 2013-10-21 NOTE — Progress Notes (Signed)
Pre-visit discussion using our clinic review tool. No additional management support is needed unless otherwise documented below in the visit note.  He had scratched his R shin prev.  He thought it was healing. Then the developed R leg cellulitis with lesions draining.  Chills noted at the time.  Admitted, noted reviewed.  Now on clindamycin by mouth.  Prev on IV abx while inpatient.   Now with less erythema.  No fevers or chills.  Compliant with abx.  [facility].  Will have f/u there once a week.  In compression wrap now.  Has an rx for compression stockings for the future.  He was advised to elevate his legs as much as possible.  No pain now other than some stinging occ at the wounds.    He is working on weight via diet. Discussed.    He has some diarrhea on the clinda.  Discussed.  No mucous, no abd pain.   Meds, vitals, and allergies reviewed.   ROS: See HPI.  Otherwise, noncontributory.  nad rrr ctab abd soft Morbidly obese We didn't take off the compression stocking as it was just placed.  Per patient, dec in erythema.

## 2013-10-21 NOTE — Assessment & Plan Note (Signed)
Declined nutrition; will work on diet.  Recheck in about 3 months.  Discussed diet and goals.

## 2013-10-21 NOTE — Patient Instructions (Signed)
Notify us or the wound clinic if you have a fever or spreading redness.  If the diarrhea gets worse or doesn't resolve, then notify me.  Work on gradual weight loss.  Recheck in 3 months.   Take care.

## 2013-10-21 NOTE — Assessment & Plan Note (Signed)
No fevers, continue abx.  He has f/u with the wound center.  If fever or spreading erythema then notify me.  Diarrhea likely abx associated, if not improved or if mucous in stool then notify me.  He agrees.

## 2013-10-22 NOTE — H&P (Signed)
[name]:  Benjamin Patterson, Benjamin Patterson               ACCOUNT NO.:  [id number]  MEDICAL RECORD NO.:  [id number]  LOCATION:  FOOT                         FACILITY:  [facility]  PHYSICIAN:  Joanne Gaveloy Kristien Salatino, M.D.        DATE OF BIRTH:  1961-02-18  DATE OF ADMISSION:  10/21/2013 DATE OF DISCHARGE:                             HISTORY & PHYSICAL   CHIEF COMPLAINT:  Wounds, right leg.  HISTORY OF PRESENT ILLNESS:  This is a morbidly obese patient who developed cellulitis several weeks ago, was treated in the hospital. After treatment in the hospital with a great deal of fluid reduction, he had several wounds draining from his right leg.  PAST MEDICAL HISTORY:  Significant for paroxysmal atrial fibrillation which was treated with medication, hypertension, peripheral vascular disease, osteoarthritis, and lymphedema.  PAST SURGICAL HISTORY:  Includes heart catheterization.  SOCIAL HISTORY:  Cigarettes, none.  Alcohol, none.  MEDICATIONS:  Aspirin, clindamycin, loratadine, and metoprolol.  ALLERGIES:  None.  REVIEW OF SYSTEMS:  Essentially negative.  PHYSICAL EXAMINATION:  VITAL SIGNS:  Temperature 98.1, pulse 52 and regular, respirations 18, blood pressure 144/87. GENERAL APPEARANCE:  Well developed, very obese, in no distress. CHEST:  Clear. HEART:  Regular rhythm at present. EXTREMITIES:  Examination of the lower extremities reveals dorsalis pedis pulses are palpable.  On the right lower extremity, there are 3 superficial wounds, one is 3.0 x 7.5; one, 4.3 x 0.6; and one is 1.0 x 0.5.  There are signs of stasis dermatitis bilaterally.  IMPRESSION:  Chronic venous hypertension with inflammation and ulcer.  PLAN OF TREATMENT:  Silver alginate and Unna boot.  We will see him in 7 days.     Joanne Gaveloy [name], M.D.     RA/MEDQ  D:  10/21/2013  T:  10/22/2013  Job:  [id number]

## 2013-10-28 DIAGNOSIS — I10 Essential (primary) hypertension: Secondary | ICD-10-CM | POA: Diagnosis not present

## 2013-10-28 DIAGNOSIS — Z7982 Long term (current) use of aspirin: Secondary | ICD-10-CM | POA: Diagnosis not present

## 2013-10-28 DIAGNOSIS — I87339 Chronic venous hypertension (idiopathic) with ulcer and inflammation of unspecified lower extremity: Secondary | ICD-10-CM | POA: Diagnosis present

## 2013-10-28 DIAGNOSIS — L97809 Non-pressure chronic ulcer of other part of unspecified lower leg with unspecified severity: Secondary | ICD-10-CM | POA: Diagnosis not present

## 2013-10-28 DIAGNOSIS — Z79899 Other long term (current) drug therapy: Secondary | ICD-10-CM | POA: Diagnosis not present

## 2013-10-28 DIAGNOSIS — L97909 Non-pressure chronic ulcer of unspecified part of unspecified lower leg with unspecified severity: Secondary | ICD-10-CM | POA: Diagnosis not present

## 2013-10-28 DIAGNOSIS — I739 Peripheral vascular disease, unspecified: Secondary | ICD-10-CM | POA: Diagnosis not present

## 2013-11-04 DIAGNOSIS — I10 Essential (primary) hypertension: Secondary | ICD-10-CM | POA: Diagnosis not present

## 2013-11-04 DIAGNOSIS — I739 Peripheral vascular disease, unspecified: Secondary | ICD-10-CM | POA: Diagnosis not present

## 2013-11-04 DIAGNOSIS — I87339 Chronic venous hypertension (idiopathic) with ulcer and inflammation of unspecified lower extremity: Secondary | ICD-10-CM | POA: Diagnosis present

## 2013-11-04 DIAGNOSIS — Z7982 Long term (current) use of aspirin: Secondary | ICD-10-CM | POA: Diagnosis not present

## 2013-11-04 DIAGNOSIS — L97809 Non-pressure chronic ulcer of other part of unspecified lower leg with unspecified severity: Secondary | ICD-10-CM | POA: Diagnosis not present

## 2013-11-04 DIAGNOSIS — Z79899 Other long term (current) drug therapy: Secondary | ICD-10-CM | POA: Diagnosis not present

## 2013-11-11 ENCOUNTER — Encounter (HOSPITAL_BASED_OUTPATIENT_CLINIC_OR_DEPARTMENT_OTHER): Payer: 59 | Attending: General Surgery

## 2013-11-11 DIAGNOSIS — L97909 Non-pressure chronic ulcer of unspecified part of unspecified lower leg with unspecified severity: Principal | ICD-10-CM

## 2013-11-11 DIAGNOSIS — I87339 Chronic venous hypertension (idiopathic) with ulcer and inflammation of unspecified lower extremity: Secondary | ICD-10-CM | POA: Insufficient documentation

## 2013-11-11 DIAGNOSIS — I890 Lymphedema, not elsewhere classified: Secondary | ICD-10-CM | POA: Insufficient documentation

## 2013-12-16 ENCOUNTER — Encounter (HOSPITAL_BASED_OUTPATIENT_CLINIC_OR_DEPARTMENT_OTHER): Payer: 59 | Attending: General Surgery

## 2013-12-16 DIAGNOSIS — I87339 Chronic venous hypertension (idiopathic) with ulcer and inflammation of unspecified lower extremity: Secondary | ICD-10-CM | POA: Insufficient documentation

## 2013-12-16 DIAGNOSIS — L97909 Non-pressure chronic ulcer of unspecified part of unspecified lower leg with unspecified severity: Principal | ICD-10-CM

## 2014-01-14 NOTE — Progress Notes (Signed)
[facility]  NAME:  Benjamin EvertFARMER, Herron                    ACCOUNT NO.:  MEDICAL RECORD NO.:  [id number]      DATE OF BIRTH:  1960-09-22  PHYSICIAN:  Joanne Gaveloy Kenia Teagarden, M.D.         VISIT DATE:  01/13/2014                                  OFFICE VISIT   To whom it may concern:  Benjamin Patterson was seen by us for chronic venous hypertension and multiple ulcers and inflammation of his leg.  In addition, it has been noted in the chart that the patient had chronic lymphedema at least stage II and has had for several years.  The treatment for this condition is pneumatic compression applied by the patient at home.  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     Joanne Gaveloy Reylynn Vanalstine, M.D.     RA/MEDQ  D:  01/13/2014  T:  01/14/2014  Job:  [id number]

## 2014-01-19 ENCOUNTER — Other Ambulatory Visit: Payer: Self-pay | Admitting: *Deleted

## 2014-01-19 MED ORDER — METOPROLOL TARTRATE 100 MG PO TABS: 100.0000 mg | ORAL_TABLET | Freq: Two times a day (BID) | ORAL | Status: DC

## 2014-01-27 ENCOUNTER — Ambulatory Visit: Payer: 59 | Admitting: Family Medicine

## 2014-06-01 ENCOUNTER — Other Ambulatory Visit: Payer: Self-pay | Admitting: Family Medicine

## 2014-06-23 ENCOUNTER — Other Ambulatory Visit: Payer: Self-pay | Admitting: Family Medicine

## 2014-06-23 NOTE — Telephone Encounter (Signed)
Last refill 06/01/14 #180 (90 day supply)  it was noted that patient needs to schedule his annual physical for further refills. No appointment scheduled. Left message on voicemail to call back and schedule annual physical.

## 2014-06-24 NOTE — Telephone Encounter (Signed)
Noted  

## 2014-06-24 NOTE — Telephone Encounter (Signed)
Received refill request electronically from pharmacy. Last refill done on 06/01/14 with a 90 day supply. It was noted on refill to pharmacy that patient needed to be seen for further refills.  Called patient and was advised that he does not need a refill at this time, but he is aware that he needs an appointment. . Advised patient that he has not been seen in over a year and needs to get his annual physical scheduled prior to needing more refills. Patient stated that he will have to call back and get this scheduled.

## 2014-07-11 ENCOUNTER — Other Ambulatory Visit: Payer: Self-pay | Admitting: Family Medicine

## 2014-07-11 ENCOUNTER — Encounter: Payer: Self-pay | Admitting: Family Medicine

## 2014-07-11 ENCOUNTER — Ambulatory Visit (INDEPENDENT_AMBULATORY_CARE_PROVIDER_SITE_OTHER): Payer: 59 | Admitting: Family Medicine

## 2014-07-11 VITALS — BP 122/101 | HR 63 | Temp 98.2°F | Ht 70.0 in | Wt >= 6400 oz

## 2014-07-11 DIAGNOSIS — L03115 Cellulitis of right lower limb: Secondary | ICD-10-CM

## 2014-07-11 DIAGNOSIS — L02419 Cutaneous abscess of limb, unspecified: Secondary | ICD-10-CM

## 2014-07-11 DIAGNOSIS — L03119 Cellulitis of unspecified part of limb: Secondary | ICD-10-CM | POA: Insufficient documentation

## 2014-07-11 DIAGNOSIS — I10 Essential (primary) hypertension: Secondary | ICD-10-CM

## 2014-07-11 MED ORDER — CEPHALEXIN 500 MG PO CAPS: 500.0000 mg | ORAL_CAPSULE | Freq: Three times a day (TID) | ORAL | Status: DC

## 2014-07-11 NOTE — Progress Notes (Signed)
Patient ID: Benjamin Patterson, male   DOB: 10/05/1960, 53 y.o.   MRN: [id number] Benjamin Patterson [id number] 09/28/1960 07/11/2014      Progress Note-Follow Up  Subjective  Chief Complaint  Chief Complaint  Patient presents with  . place on leg    left leg X 6 days- blister opened up this morning    HPI  Patient is a 53 year old male in today for routine medical care.started with a blister 5 days ago. Which has grown steadily. Opened and drained clear fluids this am. Mildly painful. Reports this is how his abscess and cellulitis started in January of 2015. Denies CP/palp/SOB/HA/congestion/fevers/GI or GU c/o. Taking meds as prescribed  Past Medical History  Diagnosis Date  . Hypertension   . Knee pain, bilateral   . Obesity   . Gout   . PAF (paroxysmal atrial fibrillation) Dec 2012  . S/P cardiac cath Dec 2012    Normal coronaries per cath    Past Surgical History  Procedure Laterality Date  . Cardiac catheterization  Dec 2012    Normal.    Family History  Problem Relation Age of Onset  . Heart disease Mother     Afib  . Heart disease Father     CABG X 3 (05), HTN, elevated cholesterol, TKR  . Hypertension Father   . Hyperlipidemia Father   . Hypertension Sister   . Cancer Maternal Grandmother     Ovarian cancer  . Stroke Maternal Grandfather   . Diabetes Neg Hx   . Depression Neg Hx   . Alcohol abuse Neg Hx   . Drug abuse Neg Hx   . Prostate cancer Neg Hx   . Colon cancer Neg Hx     History   Social History  . Marital Status: Married    Spouse Name: N/A    Number of Children: 1  . Years of Education: N/A   Occupational History  . 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 Smoking status: Never Smoker   . Smokeless tobacco: Never Used  . Alcohol Use: No  . Drug Use: No  . Sexual Activity: No   Other Topics Concern  . Not on file   Social History Narrative   [employer] grad '82   Married '83   Son born '90- handicapped,  potential group home placement as of 2014- Pt is checking into this.     Works for [employer], works from home    Current Outpatient Prescriptions on File Prior to Visit  Medication Sig Dispense Refill  . aspirin 325 MG tablet Take 325 mg by mouth daily.       Marland Kitchen. loratadine (CLARITIN) 10 MG tablet Take 1 tablet (10 mg total) by mouth daily.  30 tablet  1  . metoprolol (LOPRESSOR) 100 MG tablet Take 1 tablet by mouth two  times daily  180 tablet  0  . Multiple Vitamin (MULTIVITAMIN) tablet Take 1 tablet by mouth daily.         No current facility-administered medications on file prior to visit.    No Known Allergies  Review of Systems  Review of Systems  Constitutional: Negative for fever and malaise/fatigue.  HENT: Negative for congestion.   Eyes: Negative for discharge.  Respiratory: Negative for shortness of breath.   Cardiovascular: Negative for chest pain, palpitations and leg swelling.  Gastrointestinal: Negative for nausea, abdominal pain and diarrhea.  Genitourinary: Negative for dysuria.  Musculoskeletal: Negative for  falls.  Skin: Negative for rash.  Neurological: Negative for loss of consciousness and headaches.  Endo/Heme/Allergies: Negative for polydipsia.  Psychiatric/Behavioral: Negative for depression and suicidal ideas. The patient is not nervous/anxious and does not have insomnia.     Objective  BP 142/100  Pulse 63  Temp(Src) 98.2 F (36.8 C) (Oral)  Ht 5\' 10"  (1.778 m)  Wt 492 lb 1.3 oz (223.206 kg)  BMI 70.61 kg/m2  SpO2 95%  Physical Exam  Physical Exam  Constitutional: He is oriented to person, place, and time and well-developed, well-nourished, and in no distress. No distress.  HENT:  Head: Normocephalic and atraumatic.  Eyes: Conjunctivae are normal.  Neck: Neck supple. No thyromegaly present.  Cardiovascular: Normal rate, regular rhythm and normal heart sounds.   No murmur heard. Pulmonary/Chest: Effort normal and breath sounds  normal. No respiratory distress.  Abdominal: He exhibits no distension and no mass. There is no tenderness.  Musculoskeletal: He exhibits no edema.  Neurological: He is alert and oriented to person, place, and time.  Skin: Skin is warm.  2 cm bullous lesion anterior. Lower right tibial plateau draining serous liquid. Surrounding erythema is slight.  Psychiatric: Memory, affect and judgment normal.    Lab Results  Component Value Date   TSH 0.92 06/16/2013   Lab Results  Component Value Date   WBC 7.5 10/13/2013   HGB 12.4* 10/13/2013   HCT 38.7* 10/13/2013   MCV 83.2 10/13/2013   PLT 229 10/13/2013   Lab Results  Component Value Date   CREATININE 0.78 10/13/2013   BUN 9 10/13/2013   NA 142 10/13/2013   K 4.1 10/13/2013   CL 105 10/13/2013   CO2 24 10/13/2013   Lab Results  Component Value Date   ALT 27 06/16/2013   AST 25 06/16/2013   ALKPHOS 57 06/16/2013   BILITOT 0.7 06/16/2013   Lab Results  Component Value Date   CHOL 140 06/16/2013   Lab Results  Component Value Date   HDL 36.30* 06/16/2013   Lab Results  Component Value Date   LDLCALC 87 06/16/2013   Lab Results  Component Value Date   TRIG 83.0 06/16/2013   Lab Results  Component Value Date   CHOLHDL 4 06/16/2013     Assessment & Plan  Cellulitis Started on Keflex, cleanse with Cetaphil soap daily, starte a probiotic, elevate feet above heart tid and report if no improvement.  Essential hypertension Mildly elevated with acute illness, no changes to meds. Encouraged heart healthy diet such as the DASH diet and exercise as tolerated.

## 2014-07-11 NOTE — Patient Instructions (Signed)
    Wash daily with Cetaphil soap. Elevate feet as able, start a probiotic such as Digestive Advantage or [facility] Keep covered with clean bandage and paper tape when out, uncovered when home Cellulitis Cellulitis is an infection of the skin and the tissue beneath it. The infected area is usually red and tender. Cellulitis occurs most often in the arms and lower legs.  CAUSES  Cellulitis is caused by bacteria that enter the skin through cracks or cuts in the skin. The most common types of bacteria that cause cellulitis are staphylococci and streptococci. SIGNS AND SYMPTOMS   Redness and warmth.  Swelling.  Tenderness or pain.  Fever. DIAGNOSIS  Your health care provider can usually determine what is wrong based on a physical exam. Blood tests may also be done. TREATMENT  Treatment usually involves taking an antibiotic medicine. HOME CARE INSTRUCTIONS   Take your antibiotic medicine as directed by your health care provider. Finish the antibiotic even if you start to feel better.  Keep the infected arm or leg elevated to reduce swelling.  Apply a warm cloth to the affected area up to 4 times per day to relieve pain.  Take medicines only as directed by your health care provider.  Keep all follow-up visits as directed by your health care provider. SEEK MEDICAL CARE IF:   You notice red streaks coming from the infected area.  Your red area gets larger or turns dark in color.  Your bone or joint underneath the infected area becomes painful after the skin has healed.  Your infection returns in the same area or another area.  You notice a swollen bump in the infected area.  You develop new symptoms.  You have a fever. SEEK IMMEDIATE MEDICAL CARE IF:   You feel very sleepy.  You develop vomiting or diarrhea.  You have a general ill feeling (malaise) with muscle aches and pains. MAKE SURE YOU:   Understand these instructions.  Will watch your  condition.  Will get help right away if you are not doing well or get worse. Document Released: 06/07/2005 Document Revised: 01/12/2014 Document Reviewed: 11/13/2011 [facility] Patient Information 2015 [employer] [place]. This information is not intended to replace advice given to you by your health care provider. Make sure you discuss any questions you have with your health care provider.

## 2014-07-11 NOTE — Assessment & Plan Note (Signed)
Started on Keflex, cleanse with Cetaphil soap daily, starte a probiotic, elevate feet above heart tid and report if no improvement.

## 2014-07-11 NOTE — Assessment & Plan Note (Signed)
Mildly elevated with acute illness, no changes to meds. Encouraged heart healthy diet such as the DASH diet and exercise as tolerated.  

## 2014-07-11 NOTE — Progress Notes (Signed)
Pre visit review using our clinic review tool, if applicable. No additional management support is needed unless otherwise documented below in the visit note. 

## 2014-08-19 ENCOUNTER — Encounter (HOSPITAL_COMMUNITY): Payer: Self-pay | Admitting: Cardiology

## 2014-08-26 ENCOUNTER — Other Ambulatory Visit: Payer: Self-pay | Admitting: Family Medicine

## 2014-08-26 NOTE — Telephone Encounter (Signed)
Received refill request electronically from pharmacy. Last office visit 10/21/13.Last refill note was sent that patient needed to schedule appointment for further refills per protocol. Is it okay to refill medication?

## 2014-08-27 NOTE — Telephone Encounter (Signed)
Sent, please schedule a CPE.  Thanks.   

## 2014-08-27 NOTE — Telephone Encounter (Signed)
Left detailed message on voicemail.  

## 2014-10-14 ENCOUNTER — Encounter: Payer: Self-pay | Admitting: Family Medicine

## 2014-10-14 ENCOUNTER — Ambulatory Visit (INDEPENDENT_AMBULATORY_CARE_PROVIDER_SITE_OTHER): Payer: 59 | Admitting: Family Medicine

## 2014-10-14 VITALS — BP 156/96 | HR 63 | Temp 98.1°F | Wt >= 6400 oz

## 2014-10-14 DIAGNOSIS — I10 Essential (primary) hypertension: Secondary | ICD-10-CM

## 2014-10-14 MED ORDER — METOPROLOL TARTRATE 100 MG PO TABS: ORAL_TABLET | ORAL | Status: DC

## 2014-10-14 NOTE — Patient Instructions (Addendum)
Schedule a fasting lab appointment.   Check your BP a few times out of clinic and notify me is consistently >140/>90. Try to spread your calories out during the day and avoid large meals.  Gradually work back in to exercise.   Take care.  Glad to see you.

## 2014-10-14 NOTE — Progress Notes (Signed)
Pre visit review using our clinic review tool, if applicable. No additional management support is needed unless otherwise documented below in the visit note.  Hypertension:    Using medication without problems or lightheadedness: usually would be w/o sx, occ has some orthostatic sx with sig position change.   Chest pain with exertion:no Edema:no Short of breath:no Average home BPs: not checked often.  Other issues:due for labs.  Will return fasting.   Meds, vitals, and allergies reviewed.   PMH and SH reviewed  ROS: See HPI.  Otherwise negative.    GEN: nad, alert and oriented HEENT: mucous membranes moist NECK: supple w/o LA CV: rrr. PULM: ctab, no inc wob ABD: soft, +bs EXT: no edema SKIN: no acute rash

## 2014-10-15 ENCOUNTER — Encounter: Payer: Self-pay | Admitting: Family Medicine

## 2014-10-15 NOTE — Assessment & Plan Note (Signed)
Needs sig weight loss, d/w pt.  He has been working on cutting calories, with some weight reduction.  D/w pt about spreading calories out during the day.  He understood.  D/w pt about exercise.  No change in meds for now.  Return for labs.  He'll check BP out of clinic and notify me is persistently elevated.  He agrees.

## 2014-10-16 ENCOUNTER — Encounter: Payer: Self-pay | Admitting: Family Medicine

## 2014-10-19 ENCOUNTER — Other Ambulatory Visit (INDEPENDENT_AMBULATORY_CARE_PROVIDER_SITE_OTHER): Payer: 59

## 2014-10-19 DIAGNOSIS — I10 Essential (primary) hypertension: Secondary | ICD-10-CM

## 2014-10-19 LAB — COMPREHENSIVE METABOLIC PANEL
ALT: 29 U/L (ref 0–53)
AST: 26 U/L (ref 0–37)
Albumin: 4 g/dL (ref 3.5–5.2)
Alkaline Phosphatase: 64 U/L (ref 39–117)
BUN: 8 mg/dL (ref 6–23)
CHLORIDE: 105 meq/L (ref 96–112)
CO2: 27 mEq/L (ref 19–32)
Calcium: 9.5 mg/dL (ref 8.4–10.5)
Creatinine, Ser: 0.89 mg/dL (ref 0.40–1.50)
GFR: 94.03 mL/min (ref >60.00)
GLUCOSE: 111 mg/dL — AB (ref 70–99)
Potassium: 4.7 mEq/L (ref 3.5–5.1)
SODIUM: 139 meq/L (ref 135–145)
Total Bilirubin: 0.7 mg/dL (ref 0.2–1.2)
Total Protein: 8 g/dL (ref 6.0–8.3)

## 2014-10-19 LAB — LIPID PANEL
Cholesterol: 121 mg/dL (ref 0–200)
HDL: 34.1 mg/dL — AB (ref >39.00)
LDL Cholesterol: 72 mg/dL (ref 0–99)
NonHDL: 86.9
Total CHOL/HDL Ratio: 4
Triglycerides: 76 mg/dL (ref 0.0–149.0)
VLDL: 15.2 mg/dL (ref 0.0–40.0)

## 2014-10-20 ENCOUNTER — Other Ambulatory Visit: Payer: Self-pay | Admitting: Family Medicine

## 2014-10-20 MED ORDER — LISINOPRIL 10 MG PO TABS: 10.0000 mg | ORAL_TABLET | Freq: Every day | ORAL | Status: DC

## 2014-12-30 ENCOUNTER — Encounter: Payer: Self-pay | Admitting: Family Medicine

## 2014-12-30 ENCOUNTER — Ambulatory Visit (INDEPENDENT_AMBULATORY_CARE_PROVIDER_SITE_OTHER): Payer: 59 | Admitting: Family Medicine

## 2014-12-30 VITALS — BP 128/84 | HR 64 | Temp 98.3°F | Wt >= 6400 oz

## 2014-12-30 DIAGNOSIS — I480 Paroxysmal atrial fibrillation: Secondary | ICD-10-CM

## 2014-12-30 DIAGNOSIS — R002 Palpitations: Secondary | ICD-10-CM | POA: Diagnosis not present

## 2014-12-30 DIAGNOSIS — M109 Gout, unspecified: Secondary | ICD-10-CM | POA: Diagnosis not present

## 2014-12-30 MED ORDER — COLCHICINE 0.6 MG PO TABS: 0.6000 mg | ORAL_TABLET | Freq: Every day | ORAL | Status: DC | PRN

## 2014-12-30 NOTE — Progress Notes (Signed)
Pre visit review using our clinic review tool, if applicable. No additional management support is needed unless otherwise documented below in the visit note.  Intentional weight loss with diet restrictions, using a food log.    Had some seafood recently.  See above re: weight loss.  H/o possible gout.  Started in R ankle.  He didn't know if it was a strain initially.  Then finally with MCP sx, better now.   He has a flare of gout every few years, not frequently.  He took naproxen for the pain with some relief.    Recently with episodic chest sx, at rest.  He'll get a gassy sensation, then burp and sx resolve.  No exertional CP.  No vomiting. H/o AF, but this doesn't feel similar.    Meds, vitals, and allergies reviewed.   ROS: See HPI.  Otherwise, noncontributory.  Obese Mmm Op wnl Neck supple rrr ctab abd soft, obese, not ttp Minimal R 1st MCP redness but o/w normal foot inspection.   EKG with brady noted but not irregular.  Huston FoleyBrady likely related to BB use.  Not lightheaded.

## 2014-12-30 NOTE — Patient Instructions (Signed)
Take colchicine if needed.  Take care.  Keep working on your weight.

## 2015-01-01 NOTE — Assessment & Plan Note (Signed)
rx for colchicine printed and he'll hold it.  D/w pt about gout diet.  F/u prn.  Continue with weight loss.

## 2015-01-01 NOTE — Assessment & Plan Note (Signed)
H/o, but this doesn't sound similar.  It sounds more like GI sx with burping/possible reflux.  D/w pt.  EKG reassuring, he'll check pulse during any episodes and if irregular then he'll notify me.  This doesn't appear to be an acute process.  He agrees.

## 2015-02-01 ENCOUNTER — Telehealth: Payer: Self-pay

## 2015-02-01 MED ORDER — COLCHICINE 0.6 MG PO TABS: 0.6000 mg | ORAL_TABLET | Freq: Every day | ORAL | Status: DC | PRN

## 2015-02-01 NOTE — Telephone Encounter (Signed)
Monica at pharmacy notified as instructed by telephone. Maxine GlennMonica stated that she has already filled the script and given it to the patient.

## 2015-02-01 NOTE — Telephone Encounter (Signed)
I changed the sig.  That should say for gout.  Please resend if needed. Thanks.  I apologize for the confusion.

## 2015-02-01 NOTE — Telephone Encounter (Signed)
[facility] left v/m requesting clarification of instructions for colchicine to take for cough. [facility] out pt pharmacy request cb.

## 2015-04-29 ENCOUNTER — Encounter: Payer: Self-pay | Admitting: Family Medicine

## 2015-04-29 ENCOUNTER — Telehealth: Payer: Self-pay

## 2015-04-29 ENCOUNTER — Ambulatory Visit (INDEPENDENT_AMBULATORY_CARE_PROVIDER_SITE_OTHER): Payer: 59 | Admitting: Family Medicine

## 2015-04-29 VITALS — BP 114/74 | HR 70 | Temp 97.7°F | Wt >= 6400 oz

## 2015-04-29 DIAGNOSIS — I10 Essential (primary) hypertension: Secondary | ICD-10-CM | POA: Diagnosis not present

## 2015-04-29 LAB — BASIC METABOLIC PANEL
BUN: 16 mg/dL (ref 6–23)
CO2: 25 mEq/L (ref 19–32)
Calcium: 9.8 mg/dL (ref 8.4–10.5)
Chloride: 105 mEq/L (ref 96–112)
Creatinine, Ser: 0.93 mg/dL (ref 0.40–1.50)
GFR: 89.21 mL/min (ref >60.00)
Glucose, Bld: 114 mg/dL — ABNORMAL HIGH (ref 70–99)
POTASSIUM: 4 meq/L (ref 3.5–5.1)
SODIUM: 137 meq/L (ref 135–145)

## 2015-04-29 MED ORDER — METOPROLOL TARTRATE 25 MG PO TABS
ORAL_TABLET | ORAL | Status: DC
Start: 2015-04-29 — End: 2015-11-15

## 2015-04-29 NOTE — Telephone Encounter (Signed)
PLEASE NOTE: All timestamps contained within this report are represented as [place] Standard Time. CONFIDENTIALTY NOTICE: This fax transmission is intended only for the addressee. It contains information that is legally privileged, confidential or otherwise protected from use or disclosure. If you are not the intended recipient, you are strictly prohibited from reviewing, disclosing, copying using or disseminating any of this information or taking any action in reliance on or regarding this information. If you have received this fax in error, please notify us immediately by telephone so that we can arrange for its return to [place]. Phone: [phone], Toll-Free: [phone], Fax: [phone] Page: 1 of 2 Call Id: [id number] [facility] Night - Client TELEPHONE ADVICE RECORD [facility] Call [facility] Patient Name: Benjamin Patterson Gender: Male DOB: April 15, 1961 Age: 54 Y 4 M 17 D Return Phone Number: [phone] (Primary) Address: City/State/Zip: [place] Client [facility] Night - Client Client Site [facility] - Night Physician Raechel Ache Contact Type Call Call Type Triage / Clinical Relationship To Patient Self Return Phone Number [phone] (Primary) Chief Complaint Blood Pressure Low Initial Comment Caller states his blood pressure has been running low today. He wants to know if he should take his blood pressure medication tonight. PreDisposition Did not know what to do Nurse Assessment Nurse: Lucianne Lei, RN, Lanora Manis Date/Time (Eastern Time): 04/28/2015 8:41:11 PM Confirm and document reason for call. If symptomatic, describe symptoms. ---Patient states his blood pressure is 90/61. States he did yard work today and felt weak and lightheaded. States he has lost 100 lbs since January. States his doctor has not adjusted his blood pressure medication since he lost all the weight. Patient wants to know if he should take  his Metoprolol 100 mg tonight. States he feels a little lightheaded if he gets up too fast or moves too fast. States he feels better since he came in from the yard. States he ate a good meal and this helped him. Has the patient traveled out of the country within the last 30 days? ---Not Applicable Does the patient require triage? ---Yes Related visit to physician within the last 2 weeks? ---No Does the PT have any chronic conditions? (i.e. diabetes, asthma, etc.) ---Yes List chronic conditions. ---HTN A-Fib Guidelines Guideline Title Affirmed Question Affirmed Notes Nurse Date/Time (Eastern Time) Low Blood Pressure [1] Fall in systolic BP > 20 mm Hg from normal AND [2] NOT dizzy, lightheaded, or weak (all triage questions negative) [place], RN, Lanora Manis 04/28/2015 8:45:59 PM Disp. Time Lamount Cohen Time) Disposition Final User 04/28/2015 8:49:55 PM See PCP When Office is Open (within 3 days) Yes [place], RN, Lanora Manis PLEASE NOTE: All timestamps contained within this report are represented as [place] Standard Time. CONFIDENTIALTY NOTICE: This fax transmission is intended only for the addressee. It contains information that is legally privileged, confidential or otherwise protected from use or disclosure. If you are not the intended recipient, you are strictly prohibited from reviewing, disclosing, copying using or disseminating any of this information or taking any action in reliance on or regarding this information. If you have received this fax in error, please notify us immediately by telephone so that we can arrange for its return to [place]. Phone: [phone], Toll-Free: [phone], Fax: [phone] Page: 2 of 2 Call Id: [id number] Caller Understands: Yes Disagree/Comply: Comply Care Advice Given Per Guideline SEE PCP WITHIN 3 DAYS: * You need to be seen within 2 or 3 days. Call your doctor during regular office hours and make an appointment. An  urgent care center is often  the best source of care if your doctor's office is closed or you can't get an appointment. NOTE: If office will be open tomorrow, tell caller to call then, not in 3 days. REASSURANCE: * Systolic is the first number; diastolic is the second. * Your systolic blood pressure is lower than usual, but you have told me that you are not dizzy, weak, or lightheaded. * Generally, low blood pressure is only of medical concern if it causes signs or symptoms: dizziness, fainting, shock. * You should see your doctor and have your blood pressure checked. WHERE CAN YOU GO TO GET A BLOOD PRESSURE CHECK?: * Sometimes people are not sure if they are taking the blood pressure right. * The best place to go for a blood pressure check is your doctor's (PCP) office. * You can also get your blood pressure taken at some pharmacies, a retail clinics, and an urgent care. In an emergency situation, you can have your blood pressure checked at the emergency department. CALL BACK IF: * Lightheadedness, weakness, or dizziness occurs * Systolic BP under 90 * You feel sick * You become worse. After Care Instructions Given Call Event Type User Date / Time Description Comments User: Larrie Kass, RN Date/Time Lamount Cohen Time): 04/28/2015 8:52:23 PM Patient advised to skip his evening dose of Metoprolol 100 mg and to call his doctor in the AM to make an appointment to be seen ASAP to have his blood pressure medications adjusted after having lost 100 lbs since January.

## 2015-04-29 NOTE — Telephone Encounter (Signed)
Pt advise of Dr. Lianne Bushy instructions and verbalized understanding

## 2015-04-29 NOTE — Progress Notes (Signed)
Pre visit review using our clinic review tool, if applicable. No additional management support is needed unless otherwise documented below in the visit note.  Down about 100 lbs.  He is taking 1200 calories a day.  D/w pt about diet and weight.   He had a low BP yesterday.  He was lightheaded yesterday with standing, not today.  He felt weak yesterday when trying to cut the grass.   He held his BP meds in the meantime.   Meds, vitals, and allergies reviewed.   ROS: See HPI.  Otherwise, noncontributory.  GEN: nad, alert and oriented HEENT: mucous membranes moist NECK: supple w/o LA CV: rrr.  PULM: ctab, no inc wob ABD: soft, +bs EXT: no edema SKIN: no acute rash

## 2015-04-29 NOTE — Patient Instructions (Addendum)
Stop lisinopril.  Change metoprolol to  twice a day.  If BP >140/>90 or if resting pulse >90, then increase to  twice a day.  If you have pulse <70 and BP is still up, then add back  of lisinopril.  Take care.  Update me as needed.

## 2015-04-29 NOTE — Telephone Encounter (Signed)
Call pt.  Have him skip his lisinopril and metoprolol this AM.  I'll see him later today.  Thanks.

## 2015-04-30 NOTE — Assessment & Plan Note (Signed)
Over treated now with loss of weight.  AH hx noted, d/w pt.  No palpitations.  Stop lisinopril.  Change metoprolol to  twice a day.  If BP >140/>90 or if resting pulse >90, then increase metoprolol to  twice a day.  If pulse <70 and BP is still up, then add back  of lisinopril.  He agrees.  Check bmet today.

## 2015-10-01 MED FILL — IBUPROFEN 800 MG TABLET: 800 | 4 days supply | Qty: 16 | Fill #0

## 2015-10-01 MED FILL — AMOX-CLAV 500-125 MG TABLET: 500-125 | 10 days supply | Qty: 30 | Fill #0

## 2015-10-01 MED FILL — HYDROCODON-APAP 5-325: 5-325 | 1 days supply | Qty: 10 | Fill #0

## 2015-11-03 MED FILL — METOPROLOL TARTRATE 25 MG T: 25 | 90 days supply | Qty: 180 | Fill #2

## 2015-11-15 ENCOUNTER — Encounter: Payer: Self-pay | Admitting: Family Medicine

## 2015-11-15 ENCOUNTER — Ambulatory Visit (INDEPENDENT_AMBULATORY_CARE_PROVIDER_SITE_OTHER): Payer: 59 | Admitting: Family Medicine

## 2015-11-15 ENCOUNTER — Telehealth: Payer: Self-pay | Admitting: Family Medicine

## 2015-11-15 VITALS — BP 120/78 | HR 43 | Temp 97.6°F | Wt 316.8 lb

## 2015-11-15 DIAGNOSIS — R55 Syncope and collapse: Secondary | ICD-10-CM

## 2015-11-15 LAB — CBC WITH DIFFERENTIAL/PLATELET
BASOS ABS: 0 10*3/uL (ref 0.0–0.1)
Basophils Relative: 0.4 % (ref 0.0–3.0)
EOS ABS: 0.2 10*3/uL (ref 0.0–0.7)
Eosinophils Relative: 1.8 % (ref 0.0–5.0)
HEMATOCRIT: 42.3 % (ref 39.0–52.0)
Hemoglobin: 13.9 g/dL (ref 13.0–17.0)
LYMPHS ABS: 3.7 10*3/uL (ref 0.7–4.0)
LYMPHS PCT: 35.6 % (ref 12.0–46.0)
MCHC: 32.7 g/dL (ref 30.0–36.0)
MCV: 86.8 fl (ref 78.0–100.0)
Monocytes Absolute: 0.7 10*3/uL (ref 0.1–1.0)
Monocytes Relative: 7.1 % (ref 3.0–12.0)
NEUTROS ABS: 5.7 10*3/uL (ref 1.4–7.7)
Neutrophils Relative %: 55.1 % (ref 43.0–77.0)
Platelets: 209 10*3/uL (ref 150.0–400.0)
RBC: 4.88 Mil/uL (ref 4.22–5.81)
RDW: 15.5 % (ref 11.5–15.5)
WBC: 10.3 10*3/uL (ref 4.0–10.5)

## 2015-11-15 LAB — COMPREHENSIVE METABOLIC PANEL
ALK PHOS: 54 U/L (ref 39–117)
ALT: 20 U/L (ref 0–53)
AST: 20 U/L (ref 0–37)
Albumin: 4.1 g/dL (ref 3.5–5.2)
BUN: 12 mg/dL (ref 6–23)
CHLORIDE: 104 meq/L (ref 96–112)
CO2: 27 mEq/L (ref 19–32)
Calcium: 9.8 mg/dL (ref 8.4–10.5)
Creatinine, Ser: 0.8 mg/dL (ref 0.40–1.50)
GFR: 105.93 mL/min (ref >60.00)
GLUCOSE: 90 mg/dL (ref 70–99)
Potassium: 4.9 mEq/L (ref 3.5–5.1)
SODIUM: 138 meq/L (ref 135–145)
TOTAL PROTEIN: 7.5 g/dL (ref 6.0–8.3)
Total Bilirubin: 0.7 mg/dL (ref 0.2–1.2)

## 2015-11-15 LAB — TSH: TSH: 0.65 u[IU]/mL (ref 0.35–4.50)

## 2015-11-15 NOTE — Telephone Encounter (Signed)
[facility] Day - Client TELEPHONE ADVICE RECORD [facility] Patient Name: Benjamin Patterson DOB: 05/18/1961 Initial Comment Caller states needs an appt- had a period of dizziness- Passed out for about 30 sec Nurse Assessment Nurse: Lane HackerHarley, RN, Elvin SoWindy Date/Time (Eastern Time): 11/15/2015 8:20:23 AM Confirm and document reason for call. If symptomatic, describe symptoms. You must click the next button to save text entered. ---Caller states needs an appt. He had a period of dizziness where he passed out for about 30 - 45 sec. yesterday at a [employer] funeral. He is fine now. He has lost a lot of weight over past year. Last summer had dizziness and his BP meds were med were reduced. - Denies CP, SOB, cough/fever. Has the patient traveled out of the country within the last 30 days? ---Not Applicable Does the patient have any new or worsening symptoms? ---Yes Will a triage be completed? ---Yes Related visit to physician within the last 2 weeks? ---No Does the PT have any chronic conditions? (i.e. diabetes, asthma, etc.) ---Yes List chronic conditions. ---HTN; Afib 3-4 years ago Is this a behavioral health or substance abuse call? ---No Guidelines Guideline Title Affirmed Question Affirmed Notes Fainting [1] Age > 50 years AND [2] now alert and feels fine Final Disposition User Go to ED Now (or PCP triage) Lane HackerHarley, RN, Elvin SoWindy Comments Nausea before the episode. He was very pale. Earliest available appt with Dr. Para Marchuncan is 2:15 pm - appt made. Pt was to be seen within the hour as he is over 89 y/o and more likely to be cardiac related. He reports h/o heart cath was about 3-4 years and no blockages. Please call pt back if you feel he should go on to ER. There were no other available appts at office within next hr. Referrals REFERRED TO PCP OFFICE Disagree/Comply: Comply

## 2015-11-15 NOTE — Telephone Encounter (Signed)
Patient says he is fine and will be at the appt.

## 2015-11-15 NOTE — Assessment & Plan Note (Signed)
Single episode lifetime, with prodrome, likely combination of vagal episode with BB contributing, with weight loss likely causing patient to need less BP medicine.  D/w pt.  EKG w/o acute changes but brady noted.  D/w pt.  Check basic labs, stop BB for now- can use prn in the future for abortive tx for palpitations but not as an ongoing med.   He'll update me re BP and pulse later on, and if pulse isn't higher that would direct further w/u.  He agrees.   Okay for outpatient f/u.  No sign of ominous dx.   >25 minutes spent in face to face time with patient, >50% spent in counselling or coordination of care.

## 2015-11-15 NOTE — Telephone Encounter (Signed)
If feeling better today, then okay to keep OV as planned.  Thanks.

## 2015-11-15 NOTE — Patient Instructions (Signed)
Stop metoprolol.  Check your BP and pulse in the next few days.  Update me in about 1 week, sooner if needed.  We'll contact you with your lab report. Take care.  Glad to see you.

## 2015-11-15 NOTE — Progress Notes (Signed)
Pre visit review using our clinic review tool, if applicable. No additional management support is needed unless otherwise documented below in the visit note.  Benjamin EstelleYesterday was a family member's funeral.  Was standing for about 1 hour.  He had eaten before the event, but he didn't have a settled/normal feeling in his stomach.  At the end of the service, he felt sick to his stomach and got dizzy.  He remembers leaning over then woke up on the ground.  It was witnessed.  He was out briefly, second but not minutes.  He didn't vomit.  He gathered himself and then gradually got up with assistance.  Never stopped breathing to anyone's knowledge.  He was pale at the time.  He felt better and then walked out of the cemetary.  He didn't lock his knees at the funeral.    No h/o syncope o/w. He has been lightheaded when on too much BP medicine in the past.  Intentional weight loss noted.  No CP SOB BLE edema.   Still on beta blocker today.    He was on amoxil for about 10 days after a root canal back in 10/2015.   No residual sx.  No fevers.    He doesn't get lightheaded on standing.  Last Afib vs palpitation episode was in 2016.    Meds, vitals, and allergies reviewed.   ROS: See HPI.  Otherwise, noncontributory.  GEN: nad, alert and oriented HEENT: mucous membranes moist NECK: supple w/o LA CV: brady but o/w rrr.  no murmur PULM: ctab, no inc wob ABD: soft, +bs EXT: no edema

## 2015-11-22 ENCOUNTER — Encounter: Payer: Self-pay | Admitting: Family Medicine

## 2017-05-12 ENCOUNTER — Encounter (HOSPITAL_COMMUNITY): Payer: Self-pay | Admitting: *Deleted

## 2017-05-12 ENCOUNTER — Ambulatory Visit (HOSPITAL_COMMUNITY)
Admission: EM | Admit: 2017-05-12 | Discharge: 2017-05-12 | Disposition: A | Discharge status: Home / Self Care | Payer: 59 | Attending: Emergency Medicine | Admitting: Emergency Medicine

## 2017-05-12 DIAGNOSIS — M79602 Pain in left arm: Secondary | ICD-10-CM | POA: Diagnosis not present

## 2017-05-12 DIAGNOSIS — L03112 Cellulitis of left axilla: Secondary | ICD-10-CM

## 2017-05-12 MED ORDER — DOXYCYCLINE HYCLATE 100 MG PO CAPS: 100.0000 mg | ORAL_CAPSULE | Freq: Two times a day (BID) | ORAL | 0 refills | Status: DC

## 2017-05-12 MED ORDER — CEFTRIAXONE SODIUM 1 G IJ SOLR
1.0000 g | Freq: Once | INTRAMUSCULAR | Status: AC
  Administered 2017-05-12: 1 g via INTRAMUSCULAR

## 2017-05-12 MED ORDER — CEFTRIAXONE SODIUM 1 G IJ SOLR
INTRAMUSCULAR | Status: AC
Start: 2017-05-12 — End: 2017-05-12
  Filled 2017-05-12: qty 10

## 2017-05-12 MED ORDER — LIDOCAINE HCL (PF) 1 % IJ SOLN
INTRAMUSCULAR | Status: AC
  Filled 2017-05-12: qty 2

## 2017-05-12 NOTE — Discharge Instructions (Signed)
For your cellulitis, starting on doxycycline. Take one tablet twice a day. If you develop any fever, chills, nausea, or vomiting, you may need to be reevaluated in clinic, if you have any spreading of redness spreading towards your shoulder, consider going to the emergency room. If it forms a solid, hard, painful mass, this could be an abscess that would need to be drained in clinic. Return as necessary, or follow-up with her primary care provider as needed

## 2017-05-12 NOTE — ED Provider Notes (Signed)
[facility]   [id number] 05/12/17 Arrival Time: 1206   SUBJECTIVE:  Benjamin Patterson is a 56 y.o. male who presents to the urgent care  with complaint of soreness and redness to the left upper arm near the axilla that has been present for 3 days. Has had a past history of cellulitis, believe the symptoms are similar. No fever or chills, no nausea or vomiting, has felt increased fatigue, no loss of appetite, or other systemic symptoms.     Past Medical History:  Diagnosis Date  . Gout   . Hypertension   . Knee pain, bilateral   . Obesity   . PAF (paroxysmal atrial fibrillation) [facility]) Dec 2012  . S/P cardiac cath Dec 2012   Normal coronaries per cath   Social History   Social History  . Marital status: Married    Spouse name: N/A  . Number of children: 1  . Years of education: N/A   Occupational History  . 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 Smoking status: Never Smoker  . Smokeless tobacco: Never Used  . Alcohol use No  . Drug use: No  . Sexual activity: Not on file   Other Topics Concern  . Not on file   Social History Narrative   [employer] grad '82   Married '83   Son born '90- handicapped, was placed as of 2015     Works for [employer], works from home   Current Meds  Medication Sig  . aspirin 325 MG tablet Take 325 mg by mouth daily.   . Multiple Vitamin (MULTIVITAMIN) tablet Take 1 tablet by mouth daily.     Allergies  Allergen Reactions  . Beta Adrenergic Blockers Other (See Comments)    Bradycardia      ROS: As per HPI, remainder of ROS negative.   OBJECTIVE:  Vitals:   05/12/17 1233  BP: 137/81  Pulse: 67  Resp: 20  Temp: 98.3 F (36.8 C)  TempSrc: Oral  SpO2: 98%       General Appearance:  awake, alert, oriented, in no acute distress, well developed, well nourished, in no acute distress and obese Skin:  positives: Marked area of erythema left upper arm and axilla, warm to touch, no area  of induration or abscess. Head/face:  NCAT Ears:  External- bilateral-  normal Neck:  neck- supple, no mass, non-tender Back:  no pain to palpation Lungs:  Normal expansion.  Clear to auscultation.  No rales, rhonchi, or wheezing. Heart:  Heart regular rate and rhythm Extremities: Extremities warm to touch, pink, with no edema. Musculoskeletal:  negative Peripheral Pulses:  Capillary refill <2secs, strong peripheral pulses Neurologic:  Alert and oriented Psych exam:alert,oriented, in NAD with a full range of affect, normal behavior and no psychotic features    ASSESSMENT & PLAN:  1. Cellulitis of left axilla     Meds ordered this encounter  Medications  . cefTRIAXone (ROCEPHIN) injection 1 g  . doxycycline (VIBRAMYCIN) 100 MG capsule    Sig: Take 1 capsule (100 mg total) by mouth 2 (two) times daily.    Dispense:  20 capsule    Refill:  0    Order Specific Question:   Supervising Provider    Answer:   Eustace Moore [[id number]]    Reviewed expectations re: course of current medical issues. Questions answered. Outlined signs and symptoms indicating need for more acute intervention. Patient verbalized understanding. After Visit Summary given.  Procedures:   Labs:  Labs Reviewed - No data to display  No results found.           Dorena BodoKennard, Nayden Czajka, NP 05/12/17 1451

## 2017-05-12 NOTE — ED Triage Notes (Signed)
Reports noticing lump to left axilla 3 days ago; lump increasing; redness notied to left upper arm.  Denies fevers.

## 2017-05-15 ENCOUNTER — Telehealth: Payer: Self-pay

## 2017-05-15 NOTE — Telephone Encounter (Signed)
Note:  Patient was seen in ER on 05/12/17 dx with cellulitis of the left arm.       Patient Name: Benjamin Patterson Gender: Male DOB: 05/13/1961 Age: [age] Y 5 M Return Phone Number: [phone] (Primary) City[place]: [place] [id number] Client [facility] Night - Client [facility] Site [facility] Night Physician Raechel Acheuncan, Shaw - MD Who Is Calling Patient / Member / Family / Caregiver Call Type Triage / Clinical Relationship To Patient Self Return Phone Number [phone] (Primary) Chief Complaint Skin Lesion - Moles/ Lumps/ Growths Reason for Call Symptomatic / Request for Health Information Initial Comment Caller has a lump under his arm with redness that has spread. Nurse Assessment Nurse: Shelva MajesticWest, RN, [place] Date/Time Lamount Cohen(Eastern Time): 05/12/2017 11:26:45 AM Confirm and document reason for call. If symptomatic, describe symptoms. ---Caller states lump starts in left arm pit 4" in length, has had redness underneath arm, spreading redness, a few years ago had a nasty bout of cellulites in legs. Noticed the lump on Wednesday, last weekend feeling bad/HA. Does the PT have any chronic conditions? (i.e. diabetes, asthma, etc.) ---No Guidelines Guideline Title Affirmed Question Skin Lump or Localized Swelling [1] Swelling is red AND [2] size > 2 inches (5.0 cm) (Exception: itchy area of skin) Disp. Time Lamount Cohen(Eastern Time) Disposition Final User 05/12/2017 11:38:11 AM See Physician within 4 Hours (or PCP triage) Yes Shelva MajesticWest, RN, Family Dollar StoresMadison Referrals [facility] Care Advice Given Per Guideline SEE PHYSICIAN WITHIN 4 HOURS (or PCP triage): * IF OFFICE WILL BE CLOSED AND NO PCP TRIAGE: You need to be seen within the next 3 or 4 hours. A nearby Urgent Care Center is often a good source of care. Another choice is to go to the ER. Go sooner if you become worse. CALL BACK IF: * You become worse. CARE ADVICE given per Skin  Swelling or Lump (Adult) guideline. Comments User: Linwood DibblesMadison, West, RN Date/Time Lamount Cohen(Eastern Time): 05/12/2017 11:28:54 AM skin in area is warm to touch compared to other side User: Wyn ForsterMadison, OklahomaWest, RN Date/Time Lamount Cohen(Eastern Time): 05/12/2017 11:30:12 AM PLEASE NOTE: All timestamps contained within this report are represented as [place] Standard Time. CONFIDENTIALTY NOTICE: This fax transmission is intended only for the addressee. It contains information that is legally privileged, confidential or otherwise protected from use or disclosure. If you are not the intended recipient, you are strictly prohibited from reviewing, disclosing, copying using or disseminating any of this information or taking any action in reliance on or regarding this information. If you have received this fax in error, please notify us immediately by telephone so that we can arrange for its return to us. Phone: [phone], Toll-Free: [phone], Fax: [phone] Page: 2 of 2 Call Id:

## 2017-05-16 ENCOUNTER — Encounter: Payer: Self-pay | Admitting: Family Medicine

## 2017-05-16 ENCOUNTER — Ambulatory Visit (INDEPENDENT_AMBULATORY_CARE_PROVIDER_SITE_OTHER): Payer: 59 | Admitting: Family Medicine

## 2017-05-16 ENCOUNTER — Observation Stay (HOSPITAL_COMMUNITY)
Admission: AD | Admit: 2017-05-16 | Discharge: 2017-05-18 | Disposition: A | Discharge status: Home / Self Care | Payer: 59 | Source: Ambulatory Visit | Attending: Surgery | Admitting: Surgery

## 2017-05-16 DIAGNOSIS — Z823 Family history of stroke: Secondary | ICD-10-CM | POA: Diagnosis not present

## 2017-05-16 DIAGNOSIS — Z8349 Family history of other endocrine, nutritional and metabolic diseases: Secondary | ICD-10-CM | POA: Diagnosis not present

## 2017-05-16 DIAGNOSIS — Z9889 Other specified postprocedural states: Secondary | ICD-10-CM | POA: Diagnosis not present

## 2017-05-16 DIAGNOSIS — Z7982 Long term (current) use of aspirin: Secondary | ICD-10-CM | POA: Insufficient documentation

## 2017-05-16 DIAGNOSIS — M109 Gout, unspecified: Secondary | ICD-10-CM | POA: Insufficient documentation

## 2017-05-16 DIAGNOSIS — L0291 Cutaneous abscess, unspecified: Secondary | ICD-10-CM | POA: Insufficient documentation

## 2017-05-16 DIAGNOSIS — I10 Essential (primary) hypertension: Secondary | ICD-10-CM | POA: Insufficient documentation

## 2017-05-16 DIAGNOSIS — L02412 Cutaneous abscess of left axilla: Secondary | ICD-10-CM | POA: Diagnosis not present

## 2017-05-16 DIAGNOSIS — Z01818 Encounter for other preprocedural examination: Secondary | ICD-10-CM

## 2017-05-16 DIAGNOSIS — I480 Paroxysmal atrial fibrillation: Secondary | ICD-10-CM | POA: Insufficient documentation

## 2017-05-16 DIAGNOSIS — Z79899 Other long term (current) drug therapy: Secondary | ICD-10-CM | POA: Insufficient documentation

## 2017-05-16 DIAGNOSIS — E7800 Pure hypercholesterolemia, unspecified: Secondary | ICD-10-CM | POA: Diagnosis not present

## 2017-05-16 DIAGNOSIS — I700 Atherosclerosis of aorta: Secondary | ICD-10-CM | POA: Insufficient documentation

## 2017-05-16 DIAGNOSIS — Z8249 Family history of ischemic heart disease and other diseases of the circulatory system: Secondary | ICD-10-CM | POA: Insufficient documentation

## 2017-05-16 DIAGNOSIS — Z8041 Family history of malignant neoplasm of ovary: Secondary | ICD-10-CM | POA: Insufficient documentation

## 2017-05-16 DIAGNOSIS — L02414 Cutaneous abscess of left upper limb: Secondary | ICD-10-CM | POA: Diagnosis not present

## 2017-05-16 LAB — COMPREHENSIVE METABOLIC PANEL
ALBUMIN: 3.3 g/dL — AB (ref 3.5–5.0)
ALK PHOS: 51 U/L (ref 38–126)
ALT: 24 U/L (ref 17–63)
AST: 28 U/L (ref 15–41)
Anion gap: 10 (ref 5–15)
BILIRUBIN TOTAL: 0.6 mg/dL (ref 0.3–1.2)
BUN: 16 mg/dL (ref 6–20)
CALCIUM: 8.9 mg/dL (ref 8.9–10.3)
CO2: 24 mmol/L (ref 22–32)
CREATININE: 0.79 mg/dL (ref 0.61–1.24)
Chloride: 103 mmol/L (ref 101–111)
GFR calc Af Amer: >60 mL/min (ref >60)
GLUCOSE: 101 mg/dL — AB (ref 65–99)
Potassium: 4.2 mmol/L (ref 3.5–5.1)
Sodium: 137 mmol/L (ref 135–145)
TOTAL PROTEIN: 7 g/dL (ref 6.5–8.1)

## 2017-05-16 LAB — CBC WITH DIFFERENTIAL/PLATELET
BASOS ABS: 0 10*3/uL (ref 0.0–0.1)
BASOS PCT: 0 %
EOS ABS: 0.2 10*3/uL (ref 0.0–0.7)
EOS PCT: 2 %
HCT: 39.8 % (ref 39.0–52.0)
Hemoglobin: 12.9 g/dL — ABNORMAL LOW (ref 13.0–17.0)
Lymphocytes Relative: 28 %
Lymphs Abs: 3 10*3/uL (ref 0.7–4.0)
MCH: 29.1 pg (ref 26.0–34.0)
MCHC: 32.4 g/dL (ref 30.0–36.0)
MCV: 89.6 fL (ref 78.0–100.0)
MONO ABS: 1 10*3/uL (ref 0.1–1.0)
Monocytes Relative: 9 %
Neutro Abs: 6.5 10*3/uL (ref 1.7–7.7)
Neutrophils Relative %: 61 %
Platelets: 249 10*3/uL (ref 150–400)
RBC: 4.44 MIL/uL (ref 4.22–5.81)
RDW: 14 % (ref 11.5–15.5)
WBC: 10.7 10*3/uL — ABNORMAL HIGH (ref 4.0–10.5)

## 2017-05-16 LAB — SURGICAL PCR SCREEN
MRSA, PCR: POSITIVE — AB
STAPHYLOCOCCUS AUREUS: POSITIVE — AB

## 2017-05-16 MED ORDER — ENOXAPARIN SODIUM 40 MG/0.4ML ~~LOC~~ SOLN: 40.0000 mg | SUBCUTANEOUS | Status: DC

## 2017-05-16 MED ORDER — LORATADINE 10 MG PO TABS: 10.0000 mg | ORAL_TABLET | Freq: Every day | ORAL | Status: DC | PRN

## 2017-05-16 MED ORDER — ONDANSETRON HCL 4 MG/2ML IJ SOLN: 4.0000 mg | Freq: Four times a day (QID) | INTRAMUSCULAR | Status: DC | PRN

## 2017-05-16 MED ORDER — SODIUM CHLORIDE 0.9 % IV SOLN
3.0000 g | Freq: Four times a day (QID) | INTRAVENOUS | Status: DC
  Administered 2017-05-16 – 2017-05-17 (×2): 3 g via INTRAVENOUS
  Filled 2017-05-16 (×6): qty 3

## 2017-05-16 MED ORDER — MORPHINE SULFATE (PF) 4 MG/ML IV SOLN: 1.0000 mg | INTRAVENOUS | Status: DC | PRN

## 2017-05-16 MED ORDER — OXYCODONE-ACETAMINOPHEN 5-325 MG PO TABS: 1.0000 | ORAL_TABLET | ORAL | Status: DC | PRN

## 2017-05-16 MED ORDER — ACETAMINOPHEN 325 MG PO TABS: 650.0000 mg | ORAL_TABLET | Freq: Four times a day (QID) | ORAL | Status: DC | PRN

## 2017-05-16 MED ORDER — POTASSIUM CHLORIDE IN NACL 20-0.9 MEQ/L-% IV SOLN
INTRAVENOUS | Status: DC
  Administered 2017-05-16: 22:00:00 via INTRAVENOUS
  Filled 2017-05-16: qty 1000

## 2017-05-16 MED ORDER — ONDANSETRON 4 MG PO TBDP: 4.0000 mg | ORAL_TABLET | Freq: Four times a day (QID) | ORAL | Status: DC | PRN

## 2017-05-16 MED ORDER — ACETAMINOPHEN 650 MG RE SUPP: 650.0000 mg | Freq: Four times a day (QID) | RECTAL | Status: DC | PRN

## 2017-05-16 MED ORDER — ZOLPIDEM TARTRATE 5 MG PO TABS: 5.0000 mg | ORAL_TABLET | Freq: Every evening | ORAL | Status: DC | PRN

## 2017-05-16 NOTE — Telephone Encounter (Signed)
Tried to call patient at home number and no answer or voicemail. Work number has been [employer]. Will have to try and call patient again later.

## 2017-05-16 NOTE — Assessment & Plan Note (Addendum)
Peripheral redness improved per patient report- not seen on exam today.   But the abscess has increased in size.  D/w pt. I am not comfortable draining the lesion here in the clinic.  Call placed to [facility] re: the lesion, d/w Dr. Donell BeersByerly.  She advised me to [facility] and tx.  I appreciate the help of all involved.   >15 minutes spent in face to face time with patient, >50% spent in counselling or coordination of care

## 2017-05-16 NOTE — Patient Instructions (Signed)
[facility]  Go to the admitting office.  I've already talked to the surgeons.  Take care.  Glad to see you.

## 2017-05-16 NOTE — Anesthesia Preprocedure Evaluation (Addendum)
Anesthesia Evaluation  Patient identified by MRN, date of birth, ID band Patient awake    Reviewed: Allergy & Precautions, NPO status , Patient's Chart, lab work & pertinent test results  Airway Mallampati: I  TM Distance: >3 FB Neck ROM: Full    Dental no notable dental hx.    Pulmonary neg pulmonary ROS,    Pulmonary exam normal breath sounds clear to auscultation       Cardiovascular hypertension, negative cardio ROS Normal cardiovascular exam+ dysrhythmias Atrial Fibrillation  Rhythm:Regular Rate:Normal     Neuro/Psych negative neurological ROS  negative psych ROS   GI/Hepatic negative GI ROS, Neg liver ROS,   Endo/Other  negative endocrine ROS  Renal/GU negative Renal ROS  negative genitourinary   Musculoskeletal negative musculoskeletal ROS (+)   Abdominal   Peds negative pediatric ROS (+)  Hematology negative hematology ROS (+)   Anesthesia Other Findings LEFT HEART CATHETERIZATION WITH CORONARY ANGIOGRAM  normal 08/04/2011    Reproductive/Obstetrics negative OB ROS                            Anesthesia Physical Anesthesia Plan  ASA: III  Anesthesia Plan: General   Post-op Pain Management:    Induction: Intravenous  PONV Risk Score and Plan: 2 and Ondansetron, Dexamethasone, Treatment may vary due to age or medical condition and Midazolam  Airway Management Planned: LMA and Oral ETT  Additional Equipment:   Intra-op Plan:   Post-operative Plan: Extubation in OR  Informed Consent:   Plan Discussed with: CRNA and Surgeon  Anesthesia Plan Comments: ( )        Anesthesia Quick Evaluation

## 2017-05-16 NOTE — Telephone Encounter (Signed)
Thanks.  I didn't realize that when I make the prev comment.  Will see at OV.

## 2017-05-16 NOTE — Telephone Encounter (Signed)
Patient called back. Patient has an appointment with you today to follow-up from ER visit.

## 2017-05-16 NOTE — Progress Notes (Signed)
Had spreading redness on the L arm and axilla[facility]  IM rocephin and started on doxy.  Unclear if he had a fever prev. He had some sweats at night.  Some occ chills.  That is getting better now.  He still feels tired.  Redness is receding.  The lesion hasn't drained.    He has had some occ mild headaches, some mild aches.  That was prior to the eruption of the cellulitis.    [place]   Meds, vitals, and allergies reviewed.   ROS: Per HPI unless specifically indicated in ROS section   nad L axilla with ~8cm tender fluctuant mass.  No erythema down the L arm now.

## 2017-05-16 NOTE — Telephone Encounter (Signed)
Please get update on patient.  Thanks. 

## 2017-05-16 NOTE — H&P (Signed)
Benjamin Cotham. is an 56 y.o. male.   Chief Complaint: Left upper inner arm abscess HPI: Started about one week ago, seen by primary care physician and started on doxycycline.  The redness improved, but the size of the area involved increased.  He was seen again by his primary care physician, sent to hospital because of the increased size of the abscess.  Surgery accepted for admission for surgery.  Past Medical History:  Diagnosis Date  . Gout   . Hypertension   . Knee pain, bilateral   . Obesity   . PAF (paroxysmal atrial fibrillation) [facility] Dec 2012  . S/P cardiac cath Dec 2012   Normal coronaries per cath    Past Surgical History:  Procedure Laterality Date  . CARDIAC CATHETERIZATION  Dec 2012   Normal.  . LEFT HEART CATHETERIZATION WITH CORONARY ANGIOGRAM N/A 08/04/2011   Procedure: LEFT HEART CATHETERIZATION WITH CORONARY ANGIOGRAM;  Surgeon: Herby Abraham, MD;  Location: [facility] CATH LAB;  Service: Cardiovascular;  Laterality: N/A;    Family History  Problem Relation Age of Onset  . Heart disease Mother        Afib  . Heart disease Father        CABG X 3 (05), HTN, elevated cholesterol, TKR  . Hypertension Father   . Hyperlipidemia Father   . Hypertension Sister   . Cancer Maternal Grandmother        Ovarian cancer  . Stroke Maternal Grandfather   . Diabetes Neg Hx   . Depression Neg Hx   . Alcohol abuse Neg Hx   . Drug abuse Neg Hx   . Prostate cancer Neg Hx   . Colon cancer Neg Hx    Social History:  reports that he has never smoked. He has never used smokeless tobacco. He reports that he does not drink alcohol or use drugs.  Allergies:  Allergies  Allergen Reactions  . Beta Adrenergic Blockers Other (See Comments)    Bradycardia    Medications Prior to Admission  Medication Sig Dispense Refill  . aspirin 325 MG tablet Take 325 mg by mouth daily.     . colchicine 0.6 MG tablet Take 1 tablet (0.6 mg total) by mouth daily as needed (for gout). Okay to  fill with Mitigare pills. 30 tablet 1  . doxycycline (VIBRAMYCIN) 100 MG capsule Take 1 capsule (100 mg total) by mouth 2 (two) times daily. 20 capsule 0  . loratadine (CLARITIN) 10 MG tablet Take 1 tablet (10 mg total) by mouth daily as needed.    . Multiple Vitamin (MULTIVITAMIN) tablet Take 1 tablet by mouth daily.        No results found for this or any previous visit (from the past 48 hour(s)). No results found.  Review of Systems  Constitutional: Negative for chills and fever.    Blood pressure (!) 149/82, pulse 65, temperature 98.6 F (37 C), temperature source Oral, resp. rate 20, height 5\' 10"  (1.778 m), SpO2 100 %. Physical Exam  Constitutional: He is oriented to person, place, and time. He appears well-developed and well-nourished.  Morbidly obese, but the patient has lost > 200 pounds  HENT:  Head: Normocephalic and atraumatic.  Eyes: Pupils are equal, round, and reactive to light. Conjunctivae and EOM are normal.  Neck: Normal range of motion. Neck supple.  Cardiovascular: Normal rate, regular rhythm and normal heart sounds.   Has hx of Afib, but not converted and not anticoagulated   Respiratory: Effort normal  and breath sounds normal.  GI: Soft. Bowel sounds are normal.  Musculoskeletal: Normal range of motion. He exhibits tenderness.       Left upper arm: He exhibits tenderness, swelling and edema. He exhibits no bony tenderness.       Arms: See diagram  Neurological: He is alert and oriented to person, place, and time. He has normal reflexes.  Skin: Skin is warm and dry.  Psychiatric: He has a normal mood and affect. His behavior is normal. Judgment and thought content normal.     Assessment/Plan Left upper inner arm abscess for I&D tomorrow. NPO after midnight. Unasyn for antibiotics  Jimmye NormanJAMES Tanganyika Bowlds, MD 05/16/2017, 6:53 PM

## 2017-05-17 ENCOUNTER — Encounter (HOSPITAL_COMMUNITY): Payer: Self-pay | Admitting: *Deleted

## 2017-05-17 ENCOUNTER — Observation Stay (HOSPITAL_COMMUNITY): Payer: 59 | Admitting: Anesthesiology

## 2017-05-17 ENCOUNTER — Inpatient Hospital Stay (HOSPITAL_COMMUNITY): Admission: RE | Admit: 2017-05-17 | Payer: 59 | Source: Ambulatory Visit | Admitting: Surgery

## 2017-05-17 ENCOUNTER — Observation Stay (HOSPITAL_COMMUNITY): Payer: 59

## 2017-05-17 ENCOUNTER — Encounter (HOSPITAL_COMMUNITY): Admission: AD | Disposition: A | Discharge status: Home / Self Care | Payer: Self-pay | Source: Ambulatory Visit

## 2017-05-17 DIAGNOSIS — L02412 Cutaneous abscess of left axilla: Secondary | ICD-10-CM | POA: Diagnosis not present

## 2017-05-17 DIAGNOSIS — Z7982 Long term (current) use of aspirin: Secondary | ICD-10-CM | POA: Diagnosis not present

## 2017-05-17 DIAGNOSIS — I10 Essential (primary) hypertension: Secondary | ICD-10-CM | POA: Diagnosis not present

## 2017-05-17 DIAGNOSIS — M109 Gout, unspecified: Secondary | ICD-10-CM | POA: Diagnosis not present

## 2017-05-17 DIAGNOSIS — I480 Paroxysmal atrial fibrillation: Secondary | ICD-10-CM | POA: Diagnosis not present

## 2017-05-17 DIAGNOSIS — L02414 Cutaneous abscess of left upper limb: Secondary | ICD-10-CM | POA: Diagnosis not present

## 2017-05-17 DIAGNOSIS — E7800 Pure hypercholesterolemia, unspecified: Secondary | ICD-10-CM | POA: Diagnosis not present

## 2017-05-17 DIAGNOSIS — I700 Atherosclerosis of aorta: Secondary | ICD-10-CM | POA: Diagnosis not present

## 2017-05-17 DIAGNOSIS — Z01818 Encounter for other preprocedural examination: Secondary | ICD-10-CM | POA: Diagnosis not present

## 2017-05-17 DIAGNOSIS — Z79899 Other long term (current) drug therapy: Secondary | ICD-10-CM | POA: Diagnosis not present

## 2017-05-17 HISTORY — PX: I & D EXTREMITY: SHX5045

## 2017-05-17 SURGERY — IRRIGATION AND DEBRIDEMENT EXTREMITY
Anesthesia: General | Site: Axilla | Laterality: Left

## 2017-05-17 MED ORDER — LIDOCAINE HCL (CARDIAC) 20 MG/ML IV SOLN
INTRAVENOUS | Status: DC | PRN
  Administered 2017-05-17: 100 mg via INTRATRACHEAL

## 2017-05-17 MED ORDER — MIDAZOLAM HCL 2 MG/2ML IJ SOLN
INTRAMUSCULAR | Status: AC
  Filled 2017-05-17: qty 2

## 2017-05-17 MED ORDER — LACTATED RINGERS IV SOLN
INTRAVENOUS | Status: DC
  Administered 2017-05-17: 09:00:00 via INTRAVENOUS

## 2017-05-17 MED ORDER — PROPOFOL 10 MG/ML IV BOLUS
INTRAVENOUS | Status: AC
  Filled 2017-05-17: qty 40

## 2017-05-17 MED ORDER — FENTANYL CITRATE (PF) 250 MCG/5ML IJ SOLN
INTRAMUSCULAR | Status: AC
  Filled 2017-05-17: qty 5

## 2017-05-17 MED ORDER — FENTANYL CITRATE (PF) 100 MCG/2ML IJ SOLN: 25.0000 ug | INTRAMUSCULAR | Status: DC | PRN

## 2017-05-17 MED ORDER — EPHEDRINE SULFATE 50 MG/ML IJ SOLN
INTRAMUSCULAR | Status: DC | PRN
  Administered 2017-05-17: 10 mg via INTRAVENOUS

## 2017-05-17 MED ORDER — MUPIROCIN 2 % EX OINT
1.0000 "application " | TOPICAL_OINTMENT | Freq: Two times a day (BID) | CUTANEOUS | Status: DC
  Administered 2017-05-17 (×2): 1 via NASAL
  Filled 2017-05-17: qty 22

## 2017-05-17 MED ORDER — PROPOFOL 10 MG/ML IV BOLUS
INTRAVENOUS | Status: DC | PRN
  Administered 2017-05-17: 200 mg via INTRAVENOUS

## 2017-05-17 MED ORDER — MIDAZOLAM HCL 5 MG/5ML IJ SOLN
INTRAMUSCULAR | Status: DC | PRN
  Administered 2017-05-17: 2 mg via INTRAVENOUS

## 2017-05-17 MED ORDER — MEPERIDINE HCL 25 MG/ML IJ SOLN: 6.2500 mg | INTRAMUSCULAR | Status: DC | PRN

## 2017-05-17 MED ORDER — FENTANYL CITRATE (PF) 250 MCG/5ML IJ SOLN
INTRAMUSCULAR | Status: DC | PRN
  Administered 2017-05-17: 100 ug via INTRAVENOUS

## 2017-05-17 MED ORDER — CHLORHEXIDINE GLUCONATE CLOTH 2 % EX PADS
6.0000 | MEDICATED_PAD | Freq: Every day | CUTANEOUS | Status: DC
  Administered 2017-05-17 – 2017-05-18 (×2): 6 via TOPICAL

## 2017-05-17 MED ORDER — SODIUM CHLORIDE 0.9 % IV SOLN
3.0000 g | Freq: Four times a day (QID) | INTRAVENOUS | Status: DC
  Administered 2017-05-17 – 2017-05-18 (×4): 3 g via INTRAVENOUS
  Filled 2017-05-17 (×5): qty 3

## 2017-05-17 MED ORDER — ONDANSETRON HCL 4 MG/2ML IJ SOLN: 4.0000 mg | Freq: Once | INTRAMUSCULAR | Status: DC | PRN

## 2017-05-17 MED ORDER — SODIUM CHLORIDE 0.9 % IV SOLN
INTRAVENOUS | Status: DC | PRN
  Administered 2017-05-17: 3 g via INTRAVENOUS

## 2017-05-17 MED ORDER — 0.9 % SODIUM CHLORIDE (POUR BTL) OPTIME
TOPICAL | Status: DC | PRN
  Administered 2017-05-17: 1000 mL

## 2017-05-17 MED ORDER — ONDANSETRON HCL 4 MG/2ML IJ SOLN
INTRAMUSCULAR | Status: DC | PRN
  Administered 2017-05-17: 4 mg via INTRAVENOUS

## 2017-05-17 SURGICAL SUPPLY — 36 items
BENZOIN TINCTURE PRP APPL 2/3 (GAUZE/BANDAGES/DRESSINGS) ×3 IMPLANT
BLADE CLIPPER SURG (BLADE) IMPLANT
CHLORAPREP W/TINT 26ML (MISCELLANEOUS) ×3 IMPLANT
CLEANER TIP ELECTROSURG 2X2 (MISCELLANEOUS) ×3 IMPLANT
CLOSURE WOUND 1/2 X4 (GAUZE/BANDAGES/DRESSINGS) ×1
COVER SURGICAL LIGHT HANDLE (MISCELLANEOUS) ×3 IMPLANT
DECANTER SPIKE VIAL GLASS SM (MISCELLANEOUS) IMPLANT
DRAPE LAPAROTOMY 100X72 PEDS (DRAPES) ×3 IMPLANT
DRAPE UTILITY XL STRL (DRAPES) ×6 IMPLANT
DRSG OPSITE 4X5.5 SM (GAUZE/BANDAGES/DRESSINGS) ×3 IMPLANT
ELECT REM PT RETURN 9FT ADLT (ELECTROSURGICAL) ×3
ELECTRODE REM PT RTRN 9FT ADLT (ELECTROSURGICAL) ×1 IMPLANT
GAUZE SPONGE 4X4 12PLY STRL (GAUZE/BANDAGES/DRESSINGS) ×3 IMPLANT
GAUZE SPONGE 4X4 16PLY XRAY LF (GAUZE/BANDAGES/DRESSINGS) ×3 IMPLANT
GLOVE BIO SURGEON STRL SZ7 (GLOVE) ×3 IMPLANT
GLOVE BIOGEL PI IND STRL 7.5 (GLOVE) ×1 IMPLANT
GLOVE BIOGEL PI INDICATOR 7.5 (GLOVE) ×2
GOWN STRL REUS W/ TWL LRG LVL3 (GOWN DISPOSABLE) ×2 IMPLANT
GOWN STRL REUS W/TWL LRG LVL3 (GOWN DISPOSABLE) ×4
KIT BASIN OR (CUSTOM PROCEDURE TRAY) ×3 IMPLANT
KIT ROOM TURNOVER OR (KITS) ×3 IMPLANT
NEEDLE HYPO 25GX1X1/2 BEV (NEEDLE) IMPLANT
NS IRRIG 1000ML POUR BTL (IV SOLUTION) ×3 IMPLANT
PACK SURGICAL SETUP 50X90 (CUSTOM PROCEDURE TRAY) ×3 IMPLANT
PAD ARMBOARD 7.5X6 YLW CONV (MISCELLANEOUS) ×6 IMPLANT
PENCIL BUTTON HOLSTER BLD 10FT (ELECTRODE) ×3 IMPLANT
SPONGE LAP 4X18 X RAY DECT (DISPOSABLE) ×3 IMPLANT
STRIP CLOSURE SKIN 1/2X4 (GAUZE/BANDAGES/DRESSINGS) ×2 IMPLANT
SUT MNCRL AB 4-0 PS2 18 (SUTURE) ×3 IMPLANT
SUT SILK 2 0 SH (SUTURE) IMPLANT
SUT VIC AB 3-0 SH 27 (SUTURE) ×2
SUT VIC AB 3-0 SH 27XBRD (SUTURE) ×1 IMPLANT
SYR CONTROL 10ML LL (SYRINGE) IMPLANT
TOWEL OR 17X24 6PK STRL BLUE (TOWEL DISPOSABLE) ×3 IMPLANT
TOWEL OR 17X26 10 PK STRL BLUE (TOWEL DISPOSABLE) ×3 IMPLANT
WATER STERILE IRR 1000ML POUR (IV SOLUTION) IMPLANT

## 2017-05-17 NOTE — Progress Notes (Signed)
Dressing on the affected site soaked and came off when patient walked to the bathroom, dressing changed, packing in situ. Patient observed ambulating to the unit with wife

## 2017-05-17 NOTE — Op Note (Addendum)
Preoperative diagnosis: Left arm abscess  Postoperative diagnosis: Same   Procedure: Incision and drainage of left arm abscess  Surgeon: Marin Olphristopher Kennetta Pavlovic, M.D.  Asst: R.N.  Anesthesia: LMA  Indications for procedure: Jory EeDwight B Brickman Jr. is a 56 y.o. male admitted yesterday to our emergency general surgery service with a left arm abscess. Incision and drainage of the abscess was recommended to the patient. The procedure, material risks, benefits and alternatives to surgery were discussed at length with him. His questions were answered and he elected to proceed with the planned procedure.  Description of procedure: The patient was brought into the operating room, placed in the supine position. General anesthesia was administered with LMA. The patient's left arm was placed on an arm board. All pressure points were offloaded by foam padding. The patient was prepped and draped in the usual sterile fashion. The area of fluctuance was incised and ~75cc of pus freely drained. Cultures were sent for aerobic, anaerobic and fungus. The cavity was bluntly explored and all loculations broken up. The abscess extended into the axilla and then down the mid portion of the arm. It only involved the subcutaneous tissues. The wound was then irrigated with saline. Hemostasis was verified and the wound was packed with a dry kerlex. The patient was then woken from anesthesia and brought to PACU in stable condition.  Findings: Left arm abscess  Specimen: abscess cultures  Blood loss: 10cc  Local anesthesia: None  Complications: None  Marin Olphristopher Raylyn Speckman, M.D. General, Colon & Rectal Surgery [facility] [place]

## 2017-05-17 NOTE — Progress Notes (Signed)
Patient returned to the room from PACU. Alert,Oriented x4, dressing on the affected left upper arm dry and intact. Tolerated fluids p.o.Wife at bedside.Denies pain. Kept conmfortable

## 2017-05-17 NOTE — Anesthesia Postprocedure Evaluation (Signed)
Anesthesia Post Note  Patient: Benjamin EeDwight B Capote Jr.  Procedure(s) Performed: Procedure(s) (LRB): IRRIGATION AND DEBRIDEMENT LEFT AXILLARY ABSCESS (Left)     Patient location during evaluation: PACU Anesthesia Type: General Level of consciousness: awake and alert Pain management: pain level controlled Vital Signs Assessment: post-procedure vital signs reviewed and stable Respiratory status: spontaneous breathing, nonlabored ventilation, respiratory function stable and patient connected to nasal cannula oxygen Cardiovascular status: blood pressure returned to baseline and stable Postop Assessment: no signs of nausea or vomiting Anesthetic complications: no    Last Vitals:  Vitals:   05/17/17 1015 05/17/17 1035  BP: (!) 150/83 (!) 154/90  Pulse: (!) 54 (!) 56  Resp: 17 18  Temp: (!) 36.3 C 36.4 C  SpO2: 100%     Last Pain:  Vitals:   05/17/17 1015  TempSrc:   PainSc: 4                  Khaden Gater

## 2017-05-17 NOTE — Anesthesia Procedure Notes (Signed)
Procedure Name: LMA Insertion Date/Time: 05/17/2017 9:21 AM Performed by: Marena ChancyBECKNER, Gerhardt Gleed S Pre-anesthesia Checklist: Patient identified, Emergency Drugs available, Suction available and Patient being monitored Patient Re-evaluated:Patient Re-evaluated prior to induction Oxygen Delivery Method: Circle System Utilized Preoxygenation: Pre-oxygenation with 100% oxygen Induction Type: IV induction Ventilation: Mask ventilation without difficulty LMA: LMA inserted LMA Size: 5.0 Number of attempts: 1 Airway Equipment and Method: Bite block Placement Confirmation: positive ETCO2 Tube secured with: Tape Dental Injury: Teeth and Oropharynx as per pre-operative assessment

## 2017-05-17 NOTE — Transfer of Care (Signed)
Immediate Anesthesia Transfer of Care Note  Patient: Benjamin EeDwight B Leaming Jr.  Procedure(s) Performed: Procedure(s): IRRIGATION AND DEBRIDEMENT LEFT AXILLARY ABSCESS (Left)  Patient Location: PACU  Anesthesia Type:General  Level of Consciousness: awake, alert  and oriented  Airway & Oxygen Therapy: Patient Spontanous Breathing and Patient connected to nasal cannula oxygen  Post-op Assessment: Report given to RN, Post -op Vital signs reviewed and stable and Patient moving all extremities X 4  Post vital signs: Reviewed and stable  Last Vitals:  Vitals:   05/17/17 0952 05/17/17 1006  BP: (!) 142/100 (!) 141/92  Pulse:  60  Resp: 15 18  Temp: (!) 36.1 C   SpO2: 100% 100%    Last Pain:  Vitals:   05/17/17 1006  TempSrc:   PainSc: 4       Patients Stated Pain Goal: 3 (05/17/17 1006)  Complications: No apparent anesthesia complications

## 2017-05-17 NOTE — Progress Notes (Signed)
Subjective No acute events. Doing well. NPO since midnight. No questions about procedure today.  Objective: Vital signs in last 24 hours: Temp:  [98.2 F (36.8 C)-98.8 F (37.1 C)] 98.2 F (36.8 C) (09/06 0800) Pulse Rate:  [60-66] 60 (09/06 0800) Resp:  [18-20] 18 (09/06 0800) BP: (132-149)/(75-82) 136/75 (09/06 0800) SpO2:  [98 %-100 %] 99 % (09/06 0800) Weight:  [142.7 kg (314 lb 8 oz)-142.7 kg (314 lb 9.5 oz)] 142.7 kg (314 lb 9.5 oz) (09/05 2101) Last BM Date: 05/16/17  Intake/Output from previous day: 09/05 0701 - 09/06 0700 In: 600 [I.V.:600] Out: -  Intake/Output this shift: No intake/output data recorded.  Gen: NAD, comfortable CV: RRR Pulm: Normal work of breathing Abd: Soft, NT/ND Ext: LUE - upper inner arm with fluctuant, indurated area with overlying erythema. SCDs in place  Lab Results: CBC   Recent Labs  05/16/17 2003  WBC 10.7*  HGB 12.9*  HCT 39.8  PLT 249   BMET  Recent Labs  05/16/17 2003  NA 137  K 4.2  CL 103  CO2 24  GLUCOSE 101*  BUN 16  CREATININE 0.79  CALCIUM 8.9   PT/INR No results for input(s): LABPROT, INR in the last 72 hours. ABG No results for input(s): PHART, HCO3 in the last 72 hours.  Invalid input(s): PCO2, PO2  Studies/Results:  Anti-infectives: Anti-infectives    Start     Dose/Rate Route Frequency Ordered Stop   05/16/17 2000  Ampicillin-Sulbactam (UNASYN) 3 g in sodium chloride 0.9 % 100 mL IVPB     3 g 200 mL/hr over 30 Minutes Intravenous Every 6 hours 05/16/17 1935         Assessment/Plan: Patient Active Problem List   Diagnosis Date Noted  . Abscess 05/16/2017  . Abscess of axilla, left 05/16/2017  . Syncope 11/15/2015  . Routine general medical examination at a health care facility 06/16/2013  . Gout 10/02/2011  . Snoring 10/02/2011  . PAF (paroxysmal atrial fibrillation) (HCC) 08/18/2011  . S/P cardiac cath 08/18/2011  . Atrial fibrillation with rapid ventricular response (HCC) 08/02/2011   . Weight gain, abnormal 01/17/2011  . KNEE PAIN 07/01/2007  . HYPERCHOLESTEROLEMIA, PURE 05/16/2007  . Morbid obesity (HCC) 05/06/2007  . Essential hypertension 05/06/2007   Benjamin Eewight B Kos Jr. has a left inner arm abscess. Will proceed with incision and drainage of the abscess. The various methods of treatment have been discussed with the patient and Benjamin Patterson at bedside. After consideration of risks, benefits and other options for treatment, the patient has consented to  Procedure(s): IRRIGATION AND DEBRIDEMENT LEFT AXILLARY ABSCESS as a surgical intervention .  The patients' history has been reviewed, patient examined, no change in status, stable for surgery.  I have reviewed the patients' chart and labs.  Questions were answered to Benjamin satisfaction.  The likelihood of success in reaching our treatment goals is good.  Andria Meusehristopher M Laquitta Dominski  MD 05/17/2017,8:29 AM

## 2017-05-18 ENCOUNTER — Encounter (HOSPITAL_COMMUNITY): Payer: Self-pay | Admitting: Surgery

## 2017-05-18 DIAGNOSIS — L02412 Cutaneous abscess of left axilla: Secondary | ICD-10-CM | POA: Diagnosis not present

## 2017-05-18 DIAGNOSIS — E7800 Pure hypercholesterolemia, unspecified: Secondary | ICD-10-CM | POA: Diagnosis not present

## 2017-05-18 DIAGNOSIS — Z7982 Long term (current) use of aspirin: Secondary | ICD-10-CM | POA: Diagnosis not present

## 2017-05-18 DIAGNOSIS — M109 Gout, unspecified: Secondary | ICD-10-CM | POA: Diagnosis not present

## 2017-05-18 DIAGNOSIS — I480 Paroxysmal atrial fibrillation: Secondary | ICD-10-CM | POA: Diagnosis not present

## 2017-05-18 DIAGNOSIS — I700 Atherosclerosis of aorta: Secondary | ICD-10-CM | POA: Diagnosis not present

## 2017-05-18 DIAGNOSIS — Z79899 Other long term (current) drug therapy: Secondary | ICD-10-CM | POA: Diagnosis not present

## 2017-05-18 DIAGNOSIS — I10 Essential (primary) hypertension: Secondary | ICD-10-CM | POA: Diagnosis not present

## 2017-05-18 MED ORDER — TRAMADOL HCL 50 MG PO TABS: 50.0000 mg | ORAL_TABLET | Freq: Four times a day (QID) | ORAL | Status: DC | PRN

## 2017-05-18 MED ORDER — OXYCODONE HCL 5 MG PO TABS
5.0000 mg | ORAL_TABLET | ORAL | Status: DC | PRN
  Administered 2017-05-18: 5 mg via ORAL
  Filled 2017-05-18: qty 1

## 2017-05-18 MED ORDER — ENOXAPARIN SODIUM 40 MG/0.4ML ~~LOC~~ SOLN: 40.0000 mg | SUBCUTANEOUS | Status: DC

## 2017-05-18 MED ORDER — DIPHENHYDRAMINE HCL 12.5 MG/5ML PO ELIX: 12.5000 mg | ORAL_SOLUTION | Freq: Four times a day (QID) | ORAL | Status: DC | PRN

## 2017-05-18 MED ORDER — DIPHENHYDRAMINE HCL 50 MG/ML IJ SOLN: 12.5000 mg | Freq: Four times a day (QID) | INTRAMUSCULAR | Status: DC | PRN

## 2017-05-18 MED ORDER — TRAMADOL HCL 50 MG PO TABS: 50.0000 mg | ORAL_TABLET | Freq: Four times a day (QID) | ORAL | 0 refills | Status: DC | PRN

## 2017-05-18 MED ORDER — ONDANSETRON 4 MG PO TBDP: 4.0000 mg | ORAL_TABLET | Freq: Four times a day (QID) | ORAL | Status: DC | PRN

## 2017-05-18 MED ORDER — ONDANSETRON HCL 4 MG/2ML IJ SOLN: 4.0000 mg | Freq: Four times a day (QID) | INTRAMUSCULAR | Status: DC | PRN

## 2017-05-18 MED ORDER — ACETAMINOPHEN 325 MG PO TABS
650.0000 mg | ORAL_TABLET | Freq: Four times a day (QID) | ORAL | Status: DC | PRN
  Administered 2017-05-18: 650 mg via ORAL
  Filled 2017-05-18: qty 2

## 2017-05-18 MED ORDER — MORPHINE SULFATE (PF) 4 MG/ML IV SOLN: 1.0000 mg | INTRAVENOUS | Status: DC | PRN

## 2017-05-18 MED ORDER — ACETAMINOPHEN 650 MG RE SUPP: 650.0000 mg | Freq: Four times a day (QID) | RECTAL | Status: DC | PRN

## 2017-05-18 MED ORDER — DOCUSATE SODIUM 100 MG PO CAPS: 100.0000 mg | ORAL_CAPSULE | Freq: Two times a day (BID) | ORAL | Status: DC

## 2017-05-18 MED FILL — traMADol HCL 50 MG TABS: 50 | 5 days supply | Qty: 20 | Fill #0

## 2017-05-18 NOTE — Discharge Instructions (Signed)
Incision and Drainage, Care After  Refer to this sheet in the next few weeks. These instructions provide you with information about caring for yourself after your procedure. Your health care provider may also give you more specific instructions. Your treatment has been planned according to current medical practices, but problems sometimes occur. Call your health care provider if you have any problems or questions after your procedure.  What can I expect after the procedure?  After the procedure, it is common to have:  · Pain or discomfort around your incision site.  · Drainage from your incision.     Follow these instructions at home:  ·   · Take over-the-counter and prescription medicines only as told by your health care provider.  · If you were prescribed an antibiotic medicine, take it as told by your health care provider. Do not stop taking the antibiotic even if you start to feel better.  · Follow instructions from your health care provider about:  ? How to take care of your incision.  ? When and how you should change your packing and bandage (dressing). Wash your hands with soap and water before you change your dressing. If soap and water are not available, use hand sanitizer.  ? When you should remove your dressing.  · Do not take baths, swim, or use a hot tub until your health care provider approves.  · Keep all follow-up visits as told by your health care provider. This is important.  · Check your incision area every day for signs of infection. Check for:  ? More redness, swelling, or pain.  ? More fluid or blood.  ? Warmth.  ? Pus or a bad smell.  Contact a health care provider if:  · Your cyst or abscess returns.  · You have a fever.  · You have more redness, swelling, or pain around your incision.  · You have more fluid or blood coming from your incision.  · Your incision feels warm to the touch.  · You have pus or a bad smell coming from your incision.  Get help right away if:  · You have severe pain or  bleeding.  · You cannot eat or drink without vomiting.  · You have decreased urine output.  · You become short of breath.  · You have chest pain.  · You cough up blood.  · The area where the incision and drainage occurred becomes numb or it tingles.  This information is not intended to replace advice given to you by your health care provider. Make sure you discuss any questions you have with your health care provider.  Document Released: 11/20/2011 Document Revised: 01/28/2016 Document Reviewed: 06/18/2015  Elsevier Interactive Patient Education © 2017 Elsevier Inc.   

## 2017-05-18 NOTE — Progress Notes (Signed)
Discharge to home, D/c instructions and follow up appointment discussed with wife,verbalized understanding. Dressing change prior to discharge.

## 2017-05-18 NOTE — Discharge Summary (Signed)
Patient ID: Benjamin EeDwight B Adamczak Jr. [id number] 56 y.o. 03/31/1961  05/16/2017  Discharge date and time: 05/18/17 8:51 AM  Admitting Physician: Andria Meusehristopher M Jacalyn Biggs  Discharge Physician: Andria Meusehristopher M Franchelle Foskett  Admission Diagnoses: lt axlery abcess Axillary abscess  Discharge Diagnoses: Same  Operations: Procedure(s): IRRIGATION AND DEBRIDEMENT LEFT AXILLARY ABSCESS    Discharged Condition: good    Hospital Course: Patient was admitted to the acute care surgery service and started on IV abx. The following morning he was taken to the operating room for incision and drainage of an arm abscess. The following morning, he and his wife were instructed on wound care instructions and how to pack the wound. He was deemed stable for discharge home and instructed to complete his course of doxycycline  Consults: None  Significant Diagnostic Studies: n/a  Treatments: antibiotics; surgery -incision and drainage of abscess  Disposition: Home

## 2017-05-18 NOTE — Progress Notes (Signed)
Subjective No acute events. Feeling well. Pain controlled  Objective: Vital signs in last 24 hours: Temp:  [96.9 F (36.1 C)-98.6 F (37 C)] 98.1 F (36.7 C) (09/07 0529) Pulse Rate:  [52-68] 52 (09/07 0529) Resp:  [15-20] 17 (09/07 0529) BP: (120-154)/(74-100) 129/81 (09/07 0529) SpO2:  [97 %-100 %] 99 % (09/07 0529) Last BM Date: 05/17/17  Intake/Output from previous day: 09/06 0701 - 09/07 0700 In: 1861.5 [P.O.:770; I.V.:791.5; IV Piggyback:300] Out: 5 [Blood:5] Intake/Output this shift: No intake/output data recorded.  Gen: NAD, comfortable CV: RRR Pulm: Normal work of breathing Abd: Soft, NT Ext: LUE wound with significantly improved erythema - almost resolved; dressing in place, clean. SCDs in place  Lab Results: CBC   Recent Labs  05/16/17 2003  WBC 10.7*  HGB 12.9*  HCT 39.8  PLT 249   BMET  Recent Labs  05/16/17 2003  NA 137  K 4.2  CL 103  CO2 24  GLUCOSE 101*  BUN 16  CREATININE 0.79  CALCIUM 8.9   PT/INR No results for input(s): LABPROT, INR in the last 72 hours. ABG No results for input(s): PHART, HCO3 in the last 72 hours.  Invalid input(s): PCO2, PO2  Studies/Results:  Anti-infectives: Anti-infectives    Start     Dose/Rate Route Frequency Ordered Stop   05/17/17 1200  Ampicillin-Sulbactam (UNASYN) 3 g in sodium chloride 0.9 % 100 mL IVPB     3 g 200 mL/hr over 30 Minutes Intravenous Every 6 hours 05/17/17 1033     05/16/17 2000  Ampicillin-Sulbactam (UNASYN) 3 g in sodium chloride 0.9 % 100 mL IVPB  Status:  Discontinued     3 g 200 mL/hr over 30 Minutes Intravenous Every 6 hours 05/16/17 1935 05/17/17 0830       Assessment/Plan: Patient Active Problem List   Diagnosis Date Noted  . Abscess 05/16/2017  . Abscess of axilla, left 05/16/2017  . Syncope 11/15/2015  . Routine general medical examination at a health care facility 06/16/2013  . Gout 10/02/2011  . Snoring 10/02/2011  . PAF (paroxysmal atrial fibrillation)  (HCC) 08/18/2011  . S/P cardiac cath 08/18/2011  . Atrial fibrillation with rapid ventricular response (HCC) 08/02/2011  . Weight gain, abnormal 01/17/2011  . KNEE PAIN 07/01/2007  . HYPERCHOLESTEROLEMIA, PURE 05/16/2007  . Morbid obesity (HCC) 05/06/2007  . Essential hypertension 05/06/2007   s/p Procedure(s): IRRIGATION AND DEBRIDEMENT LEFT AXILLARY ABSCESS 05/17/2017  -Diet as tolerated -BID wound care - wick wound with moist gauze -Discussed wound care insructions with patient and his wife - she is comfortable with the dressing changes. Will discharge today - instructed to complete course of doxycycline he has at home for the cellulitis around the wound that's resolving -RTO in 1 week for wound check with our PA   LOS: 1 day   Andria Meusehristopher M Adal Sereno, MD [facility]

## 2017-05-22 LAB — AEROBIC/ANAEROBIC CULTURE W GRAM STAIN (SURGICAL/DEEP WOUND)

## 2017-05-22 LAB — AEROBIC/ANAEROBIC CULTURE (SURGICAL/DEEP WOUND)

## 2017-06-04 DIAGNOSIS — Z9889 Other specified postprocedural states: Secondary | ICD-10-CM | POA: Diagnosis not present

## 2018-01-09 ENCOUNTER — Other Ambulatory Visit: Payer: Self-pay | Admitting: Family Medicine

## 2018-01-09 ENCOUNTER — Other Ambulatory Visit (INDEPENDENT_AMBULATORY_CARE_PROVIDER_SITE_OTHER): Payer: No Typology Code available for payment source

## 2018-01-09 DIAGNOSIS — I480 Paroxysmal atrial fibrillation: Secondary | ICD-10-CM | POA: Diagnosis not present

## 2018-01-09 DIAGNOSIS — E7800 Pure hypercholesterolemia, unspecified: Secondary | ICD-10-CM | POA: Diagnosis not present

## 2018-01-09 DIAGNOSIS — M1000 Idiopathic gout, unspecified site: Secondary | ICD-10-CM

## 2018-01-09 LAB — COMPREHENSIVE METABOLIC PANEL
ALBUMIN: 3.9 g/dL (ref 3.5–5.2)
ALK PHOS: 47 U/L (ref 39–117)
ALT: 14 U/L (ref 0–53)
AST: 16 U/L (ref 0–37)
BUN: 21 mg/dL (ref 6–23)
CALCIUM: 9.2 mg/dL (ref 8.4–10.5)
CHLORIDE: 103 meq/L (ref 96–112)
CO2: 29 mEq/L (ref 19–32)
CREATININE: 0.84 mg/dL (ref 0.40–1.50)
GFR: 99.38 mL/min (ref >60.00)
Glucose, Bld: 90 mg/dL (ref 70–99)
Potassium: 4.3 mEq/L (ref 3.5–5.1)
Sodium: 139 mEq/L (ref 135–145)
TOTAL PROTEIN: 7.1 g/dL (ref 6.0–8.3)
Total Bilirubin: 0.3 mg/dL (ref 0.2–1.2)

## 2018-01-09 LAB — LIPID PANEL
CHOLESTEROL: 134 mg/dL (ref 0–200)
HDL: 49.7 mg/dL (ref >39.00)
LDL CALC: 69 mg/dL (ref 0–99)
NonHDL: 84.45
Total CHOL/HDL Ratio: 3
Triglycerides: 75 mg/dL (ref 0.0–149.0)
VLDL: 15 mg/dL (ref 0.0–40.0)

## 2018-01-09 LAB — TSH: TSH: 1.5 u[IU]/mL (ref 0.35–4.50)

## 2018-01-09 LAB — URIC ACID: Uric Acid, Serum: 6.8 mg/dL (ref 4.0–7.8)

## 2018-01-15 ENCOUNTER — Ambulatory Visit (INDEPENDENT_AMBULATORY_CARE_PROVIDER_SITE_OTHER): Payer: No Typology Code available for payment source | Admitting: Family Medicine

## 2018-01-15 ENCOUNTER — Encounter: Payer: Self-pay | Admitting: Family Medicine

## 2018-01-15 VITALS — BP 134/96 | HR 73 | Temp 98.4°F | Ht 70.0 in | Wt 336.2 lb

## 2018-01-15 DIAGNOSIS — Z0000 Encounter for general adult medical examination without abnormal findings: Secondary | ICD-10-CM

## 2018-01-15 DIAGNOSIS — I480 Paroxysmal atrial fibrillation: Secondary | ICD-10-CM

## 2018-01-15 DIAGNOSIS — Z7189 Other specified counseling: Secondary | ICD-10-CM

## 2018-01-15 DIAGNOSIS — Z1159 Encounter for screening for other viral diseases: Secondary | ICD-10-CM

## 2018-01-15 NOTE — Progress Notes (Signed)
CPE- See plan.  Routine anticipatory guidance given to patient.  See health maintenance.  The possibility exists that previously documented standard health maintenance information may have been brought forward from a previous encounter into this note.  If needed, that same information has been updated to reflect the current situation based on today's encounter.    Routine anticipatory guidance given to patient.  See health maintenance. Tetanus 2014 Flu encouraged.   PNA and shingles d/w pt.  Not due yet.   Diet and exercise d/w pt.   see below.  [facility].   Living will done- wife designated if patient were incapacitated.   D/w patient [id number] for colon cancer screening, including IFOB vs. colonoscopy.  Risks and benefits of both were discussed and patient voiced understanding.  Pt elects for: cologuard- I asked him to check on coverage for this first.  D/w pt.  See AVS.   Prostate cancer screening and PSA options (with potential risks and benefits of testing vs not testing) were discussed along with recent recs/guidelines.  He declined testing PSA at this point. D/w pt about HIV and HCV screening.  He opts in for HCV.  Deferred HIV.   He has been stress eating and his weight is creeping back up.  D/w pt about options for mgmt.  His father has dementia.  Both parents need to use a walker.  He is trying to manage that, they live about 1.5 hours away.  He is going to check on ALF for them.  He is helping pay bills, etc.  He is going to restart journaling his food intake.   PMH and SH reviewed  Meds, vitals, and allergies reviewed.   ROS: Per HPI.  Unless specifically indicated otherwise in HPI, the patient denies:  General: fever. Eyes: acute vision changes ENT: sore throat Cardiovascular: chest pain Respiratory: SOB GI: vomiting GU: dysuria Musculoskeletal: acute back pain Derm: acute rash Neuro: acute motor dysfunction Psych: worsening  mood Endocrine: polydipsia Heme: bleeding Allergy: hayfever  GEN: nad, alert and oriented HEENT: mucous membranes moist NECK: supple w/o LA CV: rrr. PULM: ctab, no inc wob ABD: soft, +bs EXT: no edema SKIN: no acute rash

## 2018-01-15 NOTE — Patient Instructions (Addendum)
Check to see if your insurance will cover cologuard.  Let me know and I can order it or we can make other arrangements.   Try journaling your food.  Keep working on exercise.  Take care.  Glad to see you.  I would get a flu shot each fall.   Thanks for your effort.

## 2018-01-16 DIAGNOSIS — Z7189 Other specified counseling: Secondary | ICD-10-CM | POA: Insufficient documentation

## 2018-01-16 NOTE — Assessment & Plan Note (Signed)
No recent symptoms.  Reasonable to continue aspirin.  Labs discussed with patient.  Discussed with patient about diet exercise and weight loss.

## 2018-01-16 NOTE — Assessment & Plan Note (Signed)
Routine anticipatory guidance given to patient.  See health maintenance. Tetanus 2014 Flu encouraged.   PNA and shingles d/w pt.  Not due yet.   Diet and exercise d/w pt.   see below.  [facility].   Living will done- wife designated if patient were incapacitated.   D/w patient [id number] for colon cancer screening, including IFOB vs. colonoscopy.  Risks and benefits of both were discussed and patient voiced understanding.  Pt elects for: cologuard- I asked him to check on coverage for this first.  D/w pt.  See AVS.   Prostate cancer screening and PSA options (with potential risks and benefits of testing vs not testing) were discussed along with recent recs/guidelines.  He declined testing PSA at this point. D/w pt about HIV and HCV screening.  He opts in for HCV.  Deferred HIV.

## 2018-01-16 NOTE — Assessment & Plan Note (Signed)
He has been stress eating and his weight is creeping back up.  D/w pt about options for mgmt.  His father has dementia.  Both parents need to use a walker.  He is trying to manage that, they live about 1.5 hours away.  He is going to check on ALF for them.  He is helping pay bills, etc.  He is going to restart journaling his food intake.

## 2018-01-16 NOTE — Assessment & Plan Note (Signed)
Living will done- wife designated if patient were incapacitated.

## 2018-01-17 ENCOUNTER — Encounter: Payer: Self-pay | Admitting: Family Medicine

## 2018-05-23 ENCOUNTER — Encounter: Payer: Self-pay | Admitting: Family Medicine

## 2018-05-28 ENCOUNTER — Encounter: Payer: 59 | Admitting: Family Medicine

## 2020-05-31 ENCOUNTER — Telehealth (INDEPENDENT_AMBULATORY_CARE_PROVIDER_SITE_OTHER): Payer: 59 | Admitting: Family Medicine

## 2020-05-31 ENCOUNTER — Encounter: Payer: Self-pay | Admitting: Family Medicine

## 2020-05-31 DIAGNOSIS — Z20822 Contact with and (suspected) exposure to covid-19: Secondary | ICD-10-CM

## 2020-05-31 NOTE — Assessment & Plan Note (Signed)
Pt with 60 lb weight gain since last office visit and unable to obtain VS for virtual visit. Unclear if his dizziness episode is related to the congestion or something else. Advised covid testing and then in-person visit as it has been 2 years since last visit and patient with hx of prediabetes, HTN, HLD and needs f/u

## 2020-05-31 NOTE — Patient Instructions (Signed)
Would recommend getting Covid Tested - isolate until you get the results - try allergy medication while you wait  Would recommend scheduling an office visit with Dr. Para March in person for blood pressure and weight follow-up

## 2020-05-31 NOTE — Progress Notes (Signed)
I connected with Benjamin Patterson. on 05/31/20 at 11:00 AM EDT by video and verified that I am speaking with the correct person using two identifiers.   I discussed the limitations, risks, security and privacy concerns of performing an evaluation and management service by video and the availability of in person appointments. I also discussed with the patient that there may be a patient responsible charge related to this service. The patient expressed understanding and agreed to proceed.  Patient location: Home Provider Location: [facility] Participants: Lynnda Child and Benjamin Patterson.   Subjective:     Benjamin Patterson. is a 59 y.o. male presenting for Nasal Congestion and Dizziness (once last week, but now experiencing foggy headed)     HPI  #Nasal congestion - has been going on for a few weeks - will have periods of congestion - mild congestion - sore throat occasionally - last week had an episode of dizziness and lightheadedness after going to the bathroom and walking back to the living room - this morning felt pretty bad - improves with antihistamine - difficulty concentrating - works at a computer  Started going back to [employer]  Recent weight gain and no access to BP cuff and wondering if BP is elevated He had stopped medication due to weight loss  Previously vaccinated to covid No loss of taste or smell   Review of Systems  Constitutional: Negative for chills and fever.  HENT: Positive for congestion and sneezing.   Eyes: Negative for itching.  Respiratory: Negative for cough and shortness of breath.   Musculoskeletal: Positive for arthralgias and myalgias.  Neurological: Positive for headaches (comes and goes).     Social History   Tobacco Use  Smoking Status Never Smoker  Smokeless Tobacco Never Used        Objective:   BP Readings from Last 3 Encounters:  01/15/18 (!) 134/96  05/18/17 129/81  05/16/17 132/80   Wt  Readings from Last 3 Encounters:  05/31/20 (!) 394 lb 6 oz (178.9 kg)  01/15/18 (!) 336 lb 4 oz (152.5 kg)  05/16/17 (!) 314 lb 9.5 oz (142.7 kg)    Wt (!) 394 lb 6 oz (178.9 kg)   BMI 56.59 kg/m    Physical Exam Constitutional:      Appearance: Normal appearance. He is not ill-appearing.  HENT:     Head: Normocephalic and atraumatic.     Right Ear: External ear normal.     Left Ear: External ear normal.  Eyes:     Conjunctiva/sclera: Conjunctivae normal.  Pulmonary:     Effort: Pulmonary effort is normal. No respiratory distress.  Neurological:     Mental Status: He is alert. Mental status is at baseline.  Psychiatric:        Mood and Affect: Mood normal.        Behavior: Behavior normal.        Thought Content: Thought content normal.        Judgment: Judgment normal.            Assessment & Plan:   Problem List Items Addressed This Visit      Other   Morbid obesity (HCC) - Primary    Pt with 60 lb weight gain since last office visit and unable to obtain VS for virtual visit. Unclear if his dizziness episode is related to the congestion or something else. Advised covid testing and then in-person visit as it  has been 2 years since last visit and patient with hx of prediabetes, HTN, HLD and needs f/u       Other Visit Diagnoses    Suspected COVID-19 virus infection         With focus difficulty + congestion + dizziness concerning for possible mild covid. Advised testing and treatment for possible allergies while awaiting.   Recommend in-person assessment.   Pt also noted some itching on the wrist and numbness but no rash. Recommend he discuss this when an in-person exam could be done.    Return in about 1 week (around 06/07/2020) for bp and weight f/u.  Lynnda Child, MD

## 2020-06-08 ENCOUNTER — Other Ambulatory Visit: Payer: Self-pay

## 2020-06-08 ENCOUNTER — Encounter: Payer: Self-pay | Admitting: Family Medicine

## 2020-06-08 ENCOUNTER — Ambulatory Visit (INDEPENDENT_AMBULATORY_CARE_PROVIDER_SITE_OTHER): Payer: 59 | Admitting: Family Medicine

## 2020-06-08 VITALS — BP 144/88 | HR 81 | Temp 96.6°F | Ht 70.0 in | Wt 399.3 lb

## 2020-06-08 DIAGNOSIS — R030 Elevated blood-pressure reading, without diagnosis of hypertension: Secondary | ICD-10-CM | POA: Diagnosis not present

## 2020-06-08 DIAGNOSIS — R739 Hyperglycemia, unspecified: Secondary | ICD-10-CM

## 2020-06-08 DIAGNOSIS — Z23 Encounter for immunization: Secondary | ICD-10-CM | POA: Diagnosis not present

## 2020-06-08 LAB — CBC WITH DIFFERENTIAL/PLATELET
Basophils Absolute: 0 10*3/uL (ref 0.0–0.1)
Basophils Relative: 0.6 % (ref 0.0–3.0)
Eosinophils Absolute: 0.1 10*3/uL (ref 0.0–0.7)
Eosinophils Relative: 1.9 % (ref 0.0–5.0)
HCT: 43.4 % (ref 39.0–52.0)
Hemoglobin: 14 g/dL (ref 13.0–17.0)
Lymphocytes Relative: 40.6 % (ref 12.0–46.0)
Lymphs Abs: 2.8 10*3/uL (ref 0.7–4.0)
MCHC: 32.3 g/dL (ref 30.0–36.0)
MCV: 88 fl (ref 78.0–100.0)
Monocytes Absolute: 0.5 10*3/uL (ref 0.1–1.0)
Monocytes Relative: 7.5 % (ref 3.0–12.0)
Neutro Abs: 3.4 10*3/uL (ref 1.4–7.7)
Neutrophils Relative %: 49.4 % (ref 43.0–77.0)
Platelets: 220 10*3/uL (ref 150.0–400.0)
RBC: 4.93 Mil/uL (ref 4.22–5.81)
RDW: 15.1 % (ref 11.5–15.5)
WBC: 6.8 10*3/uL (ref 4.0–10.5)

## 2020-06-08 LAB — COMPREHENSIVE METABOLIC PANEL
ALT: 18 U/L (ref 0–53)
AST: 19 U/L (ref 0–37)
Albumin: 4.1 g/dL (ref 3.5–5.2)
Alkaline Phosphatase: 71 U/L (ref 39–117)
BUN: 18 mg/dL (ref 6–23)
CO2: 31 mEq/L (ref 19–32)
Calcium: 9.6 mg/dL (ref 8.4–10.5)
Chloride: 104 mEq/L (ref 96–112)
Creatinine, Ser: 0.91 mg/dL (ref 0.40–1.50)
GFR: 84.56 mL/min (ref >60.00)
Glucose, Bld: 99 mg/dL (ref 70–99)
Potassium: 5.1 mEq/L (ref 3.5–5.1)
Sodium: 139 mEq/L (ref 135–145)
Total Bilirubin: 0.5 mg/dL (ref 0.2–1.2)
Total Protein: 7.2 g/dL (ref 6.0–8.3)

## 2020-06-08 LAB — HEMOGLOBIN A1C: Hgb A1c MFr Bld: 5.7 % (ref 4.6–6.5)

## 2020-06-08 NOTE — Patient Instructions (Signed)
Go to the lab on the way out.   If you have mychart we'll likely use that to update you.    Don't change your meds for now.  If your BP is consisently >140/>90 on an arm cuff, then let me know.  Take care.  Glad to see you.

## 2020-06-08 NOTE — Progress Notes (Signed)
This visit occurred during the SARS-CoV-2 public health emergency.  Safety protocols were in place, including screening questions prior to the visit, additional usage of staff PPE, and extensive cleaning of exam room while observing appropriate contact time as indicated for disinfecting solutions.  BP follow up.  Not lightheaded on standing now.  Had one episode of possible vertigo after going to bathroom.  No sx in the meantime.  No focal neurologic changes.  Weight gain in the last 18 months.  Stressors d/w pt.  Son is disabled, his mother is in the hospital with CHF in [place].  His parents moved to ALF right before covid pandemia.  His mother will need to move to SNF.  Father was been moved to memory care in the meantime.    Prev nasal congestion improved with claritin but not full relief.   Recent covid test neg. flu shot done at office visit.  He had prev [facility] and [facility].  D/w pt about options.  BP at home was higher than here, but on a wrist cuff.    Meds, vitals, and allergies reviewed.   ROS: Per HPI unless specifically indicated in ROS section   GEN: nad, alert and oriented HEENT: ncat NECK: supple w/o LA CV: rrr.  PULM: ctab, no inc wob ABD: soft, +bs EXT: no edema SKIN: Well-perfused.

## 2020-06-09 DIAGNOSIS — R030 Elevated blood-pressure reading, without diagnosis of hypertension: Secondary | ICD-10-CM | POA: Insufficient documentation

## 2020-06-09 NOTE — Assessment & Plan Note (Addendum)
We talked about options.  He can recheck his blood pressure at home with an arm cuff.  He may have been getting artificially high readings with a wrist cuff See notes on labs.  No change in meds at this point. If BP is consisently >140/>90 on an arm cuff, then he will let me know.   We talked about stressors especially with his family situation and Covid in general.  He is trying to work through this and is trying to manage his diet to lose weight.  I thanked him for his effort.

## 2022-04-22 ENCOUNTER — Ambulatory Visit (INDEPENDENT_AMBULATORY_CARE_PROVIDER_SITE_OTHER): Payer: 59

## 2022-04-22 ENCOUNTER — Ambulatory Visit
Admission: RE | Admit: 2022-04-22 | Discharge: 2022-04-22 | Disposition: A | Discharge status: Home / Self Care | Payer: 59 | Source: Ambulatory Visit | Attending: Emergency Medicine | Admitting: Emergency Medicine

## 2022-04-22 VITALS — BP 145/80 | HR 76 | Temp 96.8°F | Resp 17 | Ht 70.0 in | Wt 399.3 lb

## 2022-04-22 DIAGNOSIS — M7022 Olecranon bursitis, left elbow: Secondary | ICD-10-CM

## 2022-04-22 DIAGNOSIS — M25522 Pain in left elbow: Secondary | ICD-10-CM | POA: Diagnosis not present

## 2022-04-22 MED ORDER — NAPROXEN 500 MG PO TABS: 500.0000 mg | ORAL_TABLET | Freq: Two times a day (BID) | ORAL | 0 refills | Status: DC

## 2022-04-22 NOTE — ED Provider Notes (Signed)
Benjamin Patterson    CSN: [id number] Arrival date & time: 04/22/22  2355      History   Chief Complaint Chief Complaint  Patient presents with   Joint Pain    Elbow pain, bursitis? - Entered by patient    HPI Benjamin Patterson. is a 61 y.o. male.  Patient presents with 3-day history of pain, swelling, redness, warmth of his left elbow.  No falls or injury.  No fever, chills, open wounds, numbness, weakness, paresthesias, or other symptoms.  No OTC medications taken today; took naproxen yesterday.  His medical history includes atrial fibrillation, hypertension, obesity, gout.  The history is provided by the patient and medical records.    Past Medical History:  Diagnosis Date   Gout    Hypertension    Knee pain, bilateral    Obesity    PAF (paroxysmal atrial fibrillation) [facility]) Dec 2012   S/P cardiac cath Dec 2012   Normal coronaries per cath    Patient Active Problem List   Diagnosis Date Noted   Elevated BP without diagnosis of hypertension 06/09/2020   Advance care planning 01/16/2018   Syncope 11/15/2015   Routine general medical examination at a health care facility 06/16/2013   Gout 10/02/2011   Snoring 10/02/2011   PAF (paroxysmal atrial fibrillation) (HCC) 08/18/2011   S/P cardiac cath 08/18/2011   Weight gain, abnormal 01/17/2011   KNEE PAIN 07/01/2007   HYPERCHOLESTEROLEMIA, PURE 05/16/2007   Morbid obesity (HCC) 05/06/2007    Past Surgical History:  Procedure Laterality Date   CARDIAC CATHETERIZATION  Dec 2012   Normal.   I & D EXTREMITY Left 05/17/2017   Procedure: IRRIGATION AND DEBRIDEMENT LEFT AXILLARY ABSCESS;  Surgeon: Andria Meuse, MD;  Location: [facility] OR;  Service: General;  Laterality: Left;   LEFT HEART CATHETERIZATION WITH CORONARY ANGIOGRAM N/A 08/04/2011   Procedure: LEFT HEART CATHETERIZATION WITH CORONARY ANGIOGRAM;  Surgeon: Herby Abraham, MD;  Location: [facility] CATH LAB;  Service: Cardiovascular;  Laterality: N/A;        Home Medications    Prior to Admission medications   Medication Sig Start Date End Date Taking? Authorizing Provider  naproxen (NAPROSYN) 500 MG tablet Take 1 tablet (500 mg total) by mouth 2 (two) times daily. 04/22/22  Yes Mickie Bail, NP  aspirin 325 MG tablet Take 325 mg by mouth daily.    [provider]  loratadine (CLARITIN) 10 MG tablet Take 10 mg by mouth daily.    [provider]    Family History Family History  Problem Relation Age of Onset   Heart disease Mother        Afib   Arthritis Mother    Heart disease Father        CABG X 3 (05), HTN, elevated cholesterol, TKR   Hypertension Father    Hyperlipidemia Father    Lymphoma Father    Dementia Father    Hypertension Sister    Cancer Maternal Grandmother        Ovarian cancer   Stroke Maternal Grandfather    Diabetes Neg Hx    Depression Neg Hx    Alcohol abuse Neg Hx    Drug abuse Neg Hx    Prostate cancer Neg Hx    Colon cancer Neg Hx     Social History Social History   Tobacco Use   Smoking status: Never   Smokeless tobacco: Never  Substance Use Topics   Alcohol use: No  Alcohol/week: 0.0 standard drinks of alcohol   Drug use: No     Allergies   Beta adrenergic blockers   Review of Systems Review of Systems  Constitutional:  Negative for chills and fever.  Musculoskeletal:  Positive for arthralgias and joint swelling.  Skin:  Positive for color change. Negative for wound.  Neurological:  Negative for weakness and numbness.  All other systems reviewed and are negative.    Physical Exam Triage Vital Signs ED Triage Vitals  Enc Vitals Group     BP 04/22/22 0934 (!) 145/80     Pulse Rate 04/22/22 0934 76     Resp 04/22/22 0934 17     Temp 04/22/22 0934 (!) 96.8 F (36 C)     Temp src --      SpO2 04/22/22 0934 100 %     Weight --      Height 04/22/22 0938 5\' 10"  (1.778 m)     Head Circumference --      Peak Flow --      Pain Score 04/22/22 0937 3      Pain Loc --      Pain Edu? --      Excl. in GC? --    No data found.  Updated Vital Signs BP (!) 145/80   Pulse 76   Temp (!) 96.8 F (36 C)   Resp 17   Ht 5\' 10"  (1.778 m)   Wt (!) 399 lb 4.1 oz (181.1 kg)   SpO2 100%   BMI 57.29 kg/m   Visual Acuity Right Eye Distance:   Left Eye Distance:   Bilateral Distance:    Right Eye Near:   Left Eye Near:    Bilateral Near:     Physical Exam Vitals and nursing note reviewed.  Constitutional:      General: He is not in acute distress.    Appearance: He is well-developed. He is not ill-appearing.  HENT:     Mouth/Throat:     Mouth: Mucous membranes are moist.  Cardiovascular:     Rate and Rhythm: Normal rate and regular rhythm.  Pulmonary:     Effort: Pulmonary effort is normal. No respiratory distress.  Musculoskeletal:        General: Swelling and tenderness present. No deformity or signs of injury. Normal range of motion.       Arms:     Cervical back: Neck supple.  Skin:    General: Skin is warm and dry.     Capillary Refill: Capillary refill takes less than 2 seconds.     Findings: Erythema present. No bruising or lesion.  Neurological:     General: No focal deficit present.     Mental Status: He is alert and oriented to person, place, and time.     Sensory: No sensory deficit.     Motor: No weakness.  Psychiatric:        Mood and Affect: Mood normal.        Behavior: Behavior normal.      UC Treatments / Results  Labs (all labs ordered are listed, but only abnormal results are displayed) Labs Reviewed - No data to display  EKG   Radiology DG Elbow Complete Left  Result Date: 04/22/2022 CLINICAL DATA:  Acute LEFT elbow pain, swelling and redness. EXAM: LEFT ELBOW - COMPLETE 4 VIEW COMPARISON:  None Available. FINDINGS: There is no evidence of acute fracture or dislocation. No joint effusion is identified. Posterior soft tissue swelling is  present. No radiographic evidence of acute osteomyelitis  noted. Minimal degenerative changes in the MEDIAL joint noted. IMPRESSION: Soft tissue swelling without acute bony abnormality. Electronically Signed   By: Harmon Pier M.D.   On: 04/22/2022 10:31    Procedures Procedures (including critical care time)  Medications Ordered in UC Medications - No data to display  Initial Impression / Assessment and Plan / UC Course  I have reviewed the triage vital signs and the nursing notes.  Pertinent labs & imaging results that were available during my care of the patient were reviewed by me and considered in my medical decision making (see chart for details).   Left elbow pain, olecranon bursitis of left elbow.  X-ray shows soft tissue swelling with no acute bony abnormality.  Treating with Naprosyn, rest, ice packs, ace wrap.  Education provided on elbow bursitis.  Instructed patient to follow-up with orthopedics and contact information for on-call Ortho provided.  He agrees to plan of care.   Final Clinical Impressions(s) / UC Diagnoses   Final diagnoses:  Left elbow pain  Olecranon bursitis of left elbow     Discharge Instructions      Take the naproxen as prescribed.  Rest your elbow.  Apply ice packs 2-3 times a day for up to 15 minutes each.  Wear the Ace wrap as directed.    Follow up with an orthopedist such as the one listed below.        ED Prescriptions     Medication Sig Dispense Auth. Provider   naproxen (NAPROSYN) 500 MG tablet Take 1 tablet (500 mg total) by mouth 2 (two) times daily. 30 tablet Mickie Bail, NP      PDMP not reviewed this encounter.   Mickie Bail, NP 04/22/22 1049

## 2022-04-22 NOTE — ED Triage Notes (Signed)
Patient to urgent care with complaints of left elbow pain and swelling, describes is as sore. Warm to the touch. Reports swelling started on Thursday. Denies injury. Reports hx of gout/ cellulitis.

## 2022-04-22 NOTE — Discharge Instructions (Addendum)
Take the naproxen as prescribed.  Rest your elbow.  Apply ice packs 2-3 times a day for up to 15 minutes each.  Wear the Ace wrap as directed.    Follow up with an orthopedist such as the one listed below.

## 2024-03-31 ENCOUNTER — Encounter: Payer: Self-pay | Admitting: Nurse Practitioner

## 2024-03-31 ENCOUNTER — Ambulatory Visit: Attending: Nurse Practitioner

## 2024-03-31 ENCOUNTER — Ambulatory Visit (INDEPENDENT_AMBULATORY_CARE_PROVIDER_SITE_OTHER): Admitting: Nurse Practitioner

## 2024-03-31 VITALS — BP 138/92 | HR 58 | Temp 98.0°F | Ht 68.5 in | Wt 388.4 lb

## 2024-03-31 DIAGNOSIS — I480 Paroxysmal atrial fibrillation: Secondary | ICD-10-CM | POA: Diagnosis not present

## 2024-03-31 DIAGNOSIS — R42 Dizziness and giddiness: Secondary | ICD-10-CM

## 2024-03-31 DIAGNOSIS — E7800 Pure hypercholesterolemia, unspecified: Secondary | ICD-10-CM

## 2024-03-31 DIAGNOSIS — Z1211 Encounter for screening for malignant neoplasm of colon: Secondary | ICD-10-CM

## 2024-03-31 DIAGNOSIS — R600 Localized edema: Secondary | ICD-10-CM | POA: Diagnosis not present

## 2024-03-31 DIAGNOSIS — L03115 Cellulitis of right lower limb: Secondary | ICD-10-CM

## 2024-03-31 DIAGNOSIS — Z8739 Personal history of other diseases of the musculoskeletal system and connective tissue: Secondary | ICD-10-CM | POA: Diagnosis not present

## 2024-03-31 DIAGNOSIS — Z1159 Encounter for screening for other viral diseases: Secondary | ICD-10-CM

## 2024-03-31 DIAGNOSIS — Z125 Encounter for screening for malignant neoplasm of prostate: Secondary | ICD-10-CM | POA: Diagnosis not present

## 2024-03-31 DIAGNOSIS — Z131 Encounter for screening for diabetes mellitus: Secondary | ICD-10-CM

## 2024-03-31 DIAGNOSIS — M1000 Idiopathic gout, unspecified site: Secondary | ICD-10-CM

## 2024-03-31 DIAGNOSIS — I498 Other specified cardiac arrhythmias: Secondary | ICD-10-CM

## 2024-03-31 DIAGNOSIS — Z114 Encounter for screening for human immunodeficiency virus [HIV]: Secondary | ICD-10-CM

## 2024-03-31 LAB — LIPID PANEL
Cholesterol: 144 mg/dL (ref 0–200)
HDL: 41.6 mg/dL (ref >39.00)
LDL Cholesterol: 81 mg/dL (ref 0–99)
NonHDL: 102.67
Total CHOL/HDL Ratio: 3
Triglycerides: 109 mg/dL (ref 0.0–149.0)
VLDL: 21.8 mg/dL (ref 0.0–40.0)

## 2024-03-31 LAB — COMPREHENSIVE METABOLIC PANEL WITH GFR
ALT: 13 U/L (ref 0–53)
AST: 15 U/L (ref 0–37)
Albumin: 4.2 g/dL (ref 3.5–5.2)
Alkaline Phosphatase: 64 U/L (ref 39–117)
BUN: 15 mg/dL (ref 6–23)
CO2: 29 meq/L (ref 19–32)
Calcium: 9.4 mg/dL (ref 8.4–10.5)
Chloride: 105 meq/L (ref 96–112)
Creatinine, Ser: 0.96 mg/dL (ref 0.40–1.50)
GFR: 83.07 mL/min (ref >60.00)
Glucose, Bld: 87 mg/dL (ref 70–99)
Potassium: 5 meq/L (ref 3.5–5.1)
Sodium: 141 meq/L (ref 135–145)
Total Bilirubin: 0.6 mg/dL (ref 0.2–1.2)
Total Protein: 7.1 g/dL (ref 6.0–8.3)

## 2024-03-31 LAB — CBC
HCT: 43.4 % (ref 39.0–52.0)
Hemoglobin: 14 g/dL (ref 13.0–17.0)
MCHC: 32.3 g/dL (ref 30.0–36.0)
MCV: 86.8 fl (ref 78.0–100.0)
Platelets: 186 K/uL (ref 150.0–400.0)
RBC: 5 Mil/uL (ref 4.22–5.81)
RDW: 15.1 % (ref 11.5–15.5)
WBC: 5.5 K/uL (ref 4.0–10.5)

## 2024-03-31 LAB — URIC ACID: Uric Acid, Serum: 7.8 mg/dL (ref 4.0–7.8)

## 2024-03-31 LAB — BRAIN NATRIURETIC PEPTIDE: Pro B Natriuretic peptide (BNP): 120 pg/mL — ABNORMAL HIGH (ref 0.0–100.0)

## 2024-03-31 LAB — TSH: TSH: 1.25 u[IU]/mL (ref 0.35–5.50)

## 2024-03-31 LAB — PSA: PSA: 0.64 ng/mL (ref 0.10–4.00)

## 2024-03-31 LAB — HEMOGLOBIN A1C: Hgb A1c MFr Bld: 5.7 % (ref 4.6–6.5)

## 2024-03-31 MED ORDER — CEPHALEXIN 500 MG PO CAPS: 500.0000 mg | ORAL_CAPSULE | Freq: Three times a day (TID) | ORAL | 0 refills | Status: DC

## 2024-03-31 NOTE — Progress Notes (Signed)
 New Patient Office Visit  Subjective    Patient ID: Benjamin Gallery., male    DOB: 1961-04-23  Age: 63 y.o. MRN: [id number]  CC:  Chief Complaint  Patient presents with   Establish Care    Pt complains of lower leg swelling and dizzy spells. Declines vaccines.     HPI Benjamin Patterson. presents to establish care  Gout: 1200mg  daily  of otc gout prevention   HTN: hx of the same. He has been on medication in the past. States that he did lose weight. States that he was taken off it. States that covid hit and gained weight. He was about 250. Has lost approx 40 pounds.  Plans on losing more weight.  He thinks that the intolerance of the beta-blocker was during the time he was losing weight  PAfib: hx of the same. Mother had hx of afib.   Dizzy: [facility] and does elptical and will sweat a lot and when he does yard work he sweats a lot. He will have a sensatoin of heaviness in his chest and then feel dizzy and light headed. States that he will stop and sit for 30-23mins and it will resolve. States that he does drink a lot of water but also does a lot of coffee. He has tried to electrolyte drinks that has helped.  He does not have chest pain but heaviness. State that he could not control his breathig. States that all the other times his breathing has been normal  Leg swelling: states that he has a history of weeping that turned into cellulitis. States that he has some Teracell that he keeps on it. States that he got a blister and is using it . States that it did not unroof.  States there is some redness but no discharge  Tdap: 2018 Flu out of season  Covid: original and few boosters Pna: refused  Shingles: has not had the vaccine but had shinlges when he is young   Colonoscopy: cologuard  Psa: Due   Outpatient Encounter Medications as of 03/31/2024  Medication Sig   cephALEXin  (KEFLEX ) 500 MG capsule Take 1 capsule (500 mg total) by mouth 3 (three) times daily.   naproxen   (NAPROSYN ) 500 MG tablet Take 1 tablet (500 mg total) by mouth 2 (two) times daily.   [DISCONTINUED] aspirin  325 MG tablet Take 325 mg by mouth daily.   [DISCONTINUED] loratadine  (CLARITIN ) 10 MG tablet Take 10 mg by mouth daily.   No facility-administered encounter medications on file as of 03/31/2024.    Past Medical History:  Diagnosis Date   Cellulitis    required admission   Gout    Hypertension    Knee pain, bilateral    Obesity    PAF (paroxysmal atrial fibrillation) (HCC) 08/12/2011   S/P cardiac cath 08/12/2011   Normal coronaries per cath    Past Surgical History:  Procedure Laterality Date   CARDIAC CATHETERIZATION  Dec 2012   Normal.   I & D EXTREMITY Left 05/17/2017   Procedure: IRRIGATION AND DEBRIDEMENT LEFT AXILLARY ABSCESS;  Surgeon: Teresa Lonni HERO, MD;  Location: [facility] OR;  Service: General;  Laterality: Left;   LEFT HEART CATHETERIZATION WITH CORONARY ANGIOGRAM N/A 08/04/2011   Procedure: LEFT HEART CATHETERIZATION WITH CORONARY ANGIOGRAM;  Surgeon: Debby JONETTA Como, MD;  Location: [facility] CATH LAB;  Service: Cardiovascular;  Laterality: N/A;    Family History  Problem Relation Age of Onset   Heart disease Mother  Afib   Arthritis Mother    Heart disease Father        CABG X 3 (05), HTN, elevated cholesterol, TKR   Hypertension Father    Hyperlipidemia Father    Lymphoma Father    Dementia Father    Hypertension Sister    Cancer Maternal Grandmother        Ovarian cancer   Stroke Maternal Grandfather    Diabetes Neg Hx    Depression Neg Hx    Alcohol abuse Neg Hx    Drug abuse Neg Hx    Prostate cancer Neg Hx    Colon cancer Neg Hx     Social History   Socioeconomic History   Marital status: Married    Spouse name: Mary   Number of children: 1   Years of education: Not on file   Highest education level: Bachelor's degree (e.g., BA, AB, BS)  Occupational History   Occupation: Astronomer: [employer]  Tobacco  Use   Smoking status: Never   Smokeless tobacco: Never  Vaping Use   Vaping status: Never Used  Substance and Sexual Activity   Alcohol use: No    Alcohol/week: 0.0 standard drinks of alcohol   Drug use: No   Sexual activity: Not on file  Other Topics Concern   Not on file  Social History Narrative   [employer] grad '82   Married '83   Son born '90- handicapped, was placed as of 2015     Works for [employer], works from home   [employer] '82-'[employer], E5, no known service related issues.     Social Drivers of Corporate investment banker Strain: Low Risk  (03/30/2024)   Overall Financial Resource Strain (CARDIA)    Difficulty of Paying Living Expenses: Not very hard  Food Insecurity: No Food Insecurity (03/30/2024)   Hunger Vital Sign    Worried About Running Out of Food in the Last Year: Never true    Ran Out of Food in the Last Year: Never true  Transportation Needs: No Transportation Needs (03/30/2024)   PRAPARE - Administrator, Civil Service (Medical): No    Lack of Transportation (Non-Medical): No  Physical Activity: Insufficiently Active (03/30/2024)   Exercise Vital Sign    Days of Exercise per Week: 3 days    Minutes of Exercise per Session: 40 min  Stress: No Stress Concern Present (03/30/2024)   [employer] of Occupational Health - Occupational Stress Questionnaire    Feeling of Stress: Only a little  Social Connections: Socially Integrated (03/30/2024)   Social Connection and Isolation Panel    Frequency of Communication with Friends and Family: Three times a week    Frequency of Social Gatherings with Friends and Family: Once a week    Attends Religious Services: More than 4 times per year    Active Member of [employer] or Organizations: Yes    Attends Engineer, structural: More than 4 times per year    Marital Status: Married  Catering manager Violence: Not on file    Review of Systems  Constitutional:  Negative for chills and fever.  Respiratory:   Negative for shortness of breath.   Cardiovascular:  Positive for chest pain (heaviness), palpitations and leg swelling.  Gastrointestinal:  Negative for abdominal pain, blood in stool, constipation, diarrhea, nausea and vomiting.       Bm daily   Genitourinary:  Negative for dysuria and hematuria.  Neurological:  Positive for dizziness. Negative for tingling and headaches.  Psychiatric/Behavioral:  Negative for hallucinations and suicidal ideas.         Objective    BP (!) 138/92   Pulse (!) 58   Temp 98 F (36.7 C) (Oral)   Ht 5' 8.5 (1.74 m)   Wt (!) 388 lb 6.4 oz (176.2 kg)   SpO2 97%   BMI 58.20 kg/m   Physical Exam      Assessment & Plan:   Problem List Items Addressed This Visit       Cardiovascular and Mediastinum   PAF (paroxysmal atrial fibrillation) (HCC)   History of the same without anticoagulation.  Query if patient is having symptoms of atrial fibrillation we will do at home heart monitor EKG today showed bigeminy      Relevant Orders   EKG 12-Lead   LONG TERM MONITOR XT (3-14 DAYS)   Bigeminal rhythm   Will do at home heart monitor and consider beta-blocker use depending on ectopy burden.  Patient will get adequate hydration of 64 ounces of water a day.  Patient is drinking approximately 1 pot of coffee early in the morning per his report encourage patient to cut down to half to decrease the amount of caffeine that he is drinking.        Other   HYPERCHOLESTEROLEMIA, PURE   History of the same.  Pending lipid panel today      Relevant Orders   Lipid panel   Morbid obesity (HCC)   History of the same with ability to lose approximately 200 pounds on his own.  Patient is wanting to lose weight we did discuss options such as healthy weight and wellness and GLP-1 receptor agonist.  Patient would like through the natural way.  Pending A1c and TSH today      Relevant Orders   TSH   Gout   Dizziness - Primary   Ambiguous in nature.  EKG showed  bigeminy.  Query possible dehydration when it happened.  Patient will cut back on caffeine consumption (approximately 1 pot of coffee a day)..  Patient will also try to get 64 ounces of fluid a day with one of the drugs being infused with electrolytes.      Relevant Orders   CBC   Comprehensive metabolic panel with GFR   TSH   EKG 12-Lead   Lower extremity edema   Likely secondary to morbid obesity.  Will check BMP.  Consider doing a diuretic as patient's blood pressure is elevated with lower extremity edema.  Patient is a history of gout told him that this can increase the recurrence of gout      Relevant Orders   Brain natriuretic peptide   Cellulitis of right lower extremity   She with Keflex  500 mg 3 times daily.  Side effects reviewed when to seek care      Relevant Medications   cephALEXin  (KEFLEX ) 500 MG capsule   History of gout   History of the same patient is on over-the-counter gout prevention supplementation.  Pending uric acid level      Relevant Orders   Uric Acid   Other Visit Diagnoses       Encounter for hepatitis C screening test for low risk patient       Relevant Orders   Hepatitis C antibody     Encounter for screening for HIV       Relevant Orders   HIV antibody (with reflex)  Screening for colon cancer       Relevant Orders   Cologuard     Screening for prostate cancer       Relevant Orders   PSA     Screening for diabetes mellitus       Relevant Orders   Hemoglobin A1c       Return in about 3 months (around 07/01/2024) for weight/BP.   Adina Crandall, NP

## 2024-03-31 NOTE — Assessment & Plan Note (Signed)
 History of the same without anticoagulation.  Query if patient is having symptoms of atrial fibrillation we will do at home heart monitor EKG today showed bigeminy

## 2024-03-31 NOTE — Assessment & Plan Note (Signed)
 Ambiguous in nature.  EKG showed bigeminy.  Query possible dehydration when it happened.  Patient will cut back on caffeine consumption (approximately 1 pot of coffee a day)..  Patient will also try to get 64 ounces of fluid a day with one of the drugs being infused with electrolytes.

## 2024-03-31 NOTE — Patient Instructions (Signed)
 Nice to see you today I will be in touch with the labs once I have them Follow up with me in 3 months

## 2024-03-31 NOTE — Assessment & Plan Note (Signed)
 She with Keflex  500 mg 3 times daily.  Side effects reviewed when to seek care

## 2024-03-31 NOTE — Assessment & Plan Note (Signed)
 Likely secondary to morbid obesity.  Will check BMP.  Consider doing a diuretic as patient's blood pressure is elevated with lower extremity edema.  Patient is a history of gout told him that this can increase the recurrence of gout

## 2024-03-31 NOTE — Assessment & Plan Note (Signed)
 History of the same.  Pending lipid panel today

## 2024-03-31 NOTE — Assessment & Plan Note (Signed)
 History of the same patient is on over-the-counter gout prevention supplementation.  Pending uric acid level

## 2024-03-31 NOTE — Assessment & Plan Note (Addendum)
 Will do at home heart monitor and consider beta-blocker use depending on ectopy burden.  Patient will get adequate hydration of 64 ounces of water a day.  Patient is drinking approximately 1 pot of coffee early in the morning per his report encourage patient to cut down to half to decrease the amount of caffeine that he is drinking.

## 2024-03-31 NOTE — Assessment & Plan Note (Signed)
 History of the same with ability to lose approximately 200 pounds on his own.  Patient is wanting to lose weight we did discuss options such as healthy weight and wellness and GLP-1 receptor agonist.  Patient would like through the natural way.  Pending A1c and TSH today

## 2024-04-01 ENCOUNTER — Ambulatory Visit: Payer: Self-pay | Admitting: Nurse Practitioner

## 2024-04-01 DIAGNOSIS — R030 Elevated blood-pressure reading, without diagnosis of hypertension: Secondary | ICD-10-CM

## 2024-04-01 DIAGNOSIS — R7989 Other specified abnormal findings of blood chemistry: Secondary | ICD-10-CM

## 2024-04-01 DIAGNOSIS — R600 Localized edema: Secondary | ICD-10-CM

## 2024-04-01 LAB — HIV ANTIBODY (ROUTINE TESTING W REFLEX): HIV 1&2 Ab, 4th Generation: NONREACTIVE

## 2024-04-01 LAB — HEPATITIS C ANTIBODY: Hepatitis C Ab: NONREACTIVE

## 2024-04-01 MED ORDER — HYDROCHLOROTHIAZIDE 12.5 MG PO TABS: 12.5000 mg | ORAL_TABLET | Freq: Every day | ORAL | 0 refills | Status: DC

## 2024-04-01 NOTE — Telephone Encounter (Signed)
-----   Message from [facility] T sent at 04/01/2024  3:39 PM EDT ----- Called patient reviewed all information and repeated back to me. Will call if any questions.   Pt agrees on fluid pill and scheduled pt with next available for 8/14 @ 220pm  Pt would like his prescription to go to [facility] [address] [place]  [phone] ----- Message ----- From: Wendee Lynwood HERO, NP Sent: 04/01/2024   3:02 PM EDT To: Wendee Gunnels  Notified via My Chart   If he agrees to the fluid pill needs a 1 month follow up with me in office. I strongly recommend he go that route  ----- Message ----- From: Interface, Lab In Three Zero One Sent: 03/31/2024   2:36 PM EDT To: Lynwood HERO Wendee, NP

## 2024-04-01 NOTE — Addendum Note (Signed)
 Addended by: WENDEE LYNWOOD HERO on: 04/01/2024 03:42 PM   Modules accepted: Orders

## 2024-04-13 LAB — COLOGUARD: COLOGUARD: NEGATIVE

## 2024-04-24 ENCOUNTER — Ambulatory Visit (INDEPENDENT_AMBULATORY_CARE_PROVIDER_SITE_OTHER): Admitting: Nurse Practitioner

## 2024-04-24 VITALS — BP 132/88 | HR 60 | Temp 97.8°F | Ht 68.5 in | Wt 374.4 lb

## 2024-04-24 DIAGNOSIS — R030 Elevated blood-pressure reading, without diagnosis of hypertension: Secondary | ICD-10-CM

## 2024-04-24 DIAGNOSIS — R42 Dizziness and giddiness: Secondary | ICD-10-CM

## 2024-04-24 DIAGNOSIS — R600 Localized edema: Secondary | ICD-10-CM | POA: Diagnosis not present

## 2024-04-24 NOTE — Assessment & Plan Note (Addendum)
 Has been responsive to hydrochlorothiazide  12.5 mg daily.  Patient still has some edema left we will continue HCTZ 12.5 mg daily pending BNP and BMP

## 2024-04-24 NOTE — Assessment & Plan Note (Signed)
 Has since resolved after starting diuretic.  Continue hydrochlorothiazide .  Pending BNP and BMP today

## 2024-04-24 NOTE — Progress Notes (Signed)
 Established Patient Office Visit  Subjective   Patient ID: Benjamin Patterson., male    DOB: [birth year]  Age: 63 y.o. MRN: [id number]  Chief Complaint  Patient presents with   Follow-up    Pt complains that he has not had any dizzy spells.     HPI  Edema: Patient was last seen by me on 03/31/2024 for new patient visit.  Lab work was gotten.  Patient's BNP was elevated at 120 with elevated blood pressure patient was started on hydrochlorothiazide  12.5 mg daily.  He is here for follow-up  States that he has been taking the fluid pill. Statse that the legs hae went down some since taking it.   States that he started the heart monitor when he started with the hydrochlorothiazide . States that he was doing his morning routine and would feel dizzy/woozy. Statse that it has abated but that was the only time that it happened   States that he is drinking coffee in the morning. States that he started mixing decaf and caffienated.     Review of Systems  Constitutional:  Negative for chills and fever.  Respiratory:  Negative for shortness of breath.   Cardiovascular:  Negative for chest pain.  Neurological:  Negative for dizziness and headaches.      Objective:     BP 132/88   Pulse 60   Temp 97.8 F (36.6 C) (Oral)   Ht 5' 8.5 (1.74 m)   Wt (!) 374 lb 6.4 oz (169.8 kg)   SpO2 97%   BMI 56.10 kg/m  BP Readings from Last 3 Encounters:  04/24/24 132/88  03/31/24 (!) 138/92  04/22/22 (!) 145/80   Wt Readings from Last 3 Encounters:  04/24/24 (!) 374 lb 6.4 oz (169.8 kg)  03/31/24 (!) 388 lb 6.4 oz (176.2 kg)  04/22/22 (!) 399 lb 4.1 oz (181.1 kg)   SpO2 Readings from Last 3 Encounters:  04/24/24 97%  03/31/24 97%  04/22/22 100%      Physical Exam Vitals and nursing note reviewed.  Constitutional:      Appearance: Normal appearance.  Cardiovascular:     Rate and Rhythm: Normal rate and regular rhythm.     Heart sounds: Normal heart sounds.  Pulmonary:     Effort:  Pulmonary effort is normal.     Breath sounds: Normal breath sounds.  Musculoskeletal:     Right lower leg: 1+ Pitting Edema present.     Left lower leg: 1+ Pitting Edema present.  Neurological:     Mental Status: He is alert.      No results found for any visits on 04/24/24.    The 10-year ASCVD risk score (Arnett DK, et al., 2019) is: 13.8%    Assessment & Plan:   Problem List Items Addressed This Visit       Other   Elevated BP without diagnosis of hypertension - Primary   Blood pressure controlled today patient maintained on HCTZ 12.5 mg daily.  Patient also lost some weight.  Continue with the healthy lifestyle applications continue taking hydrochlorothiazide  as prescribed      Relevant Orders   Basic metabolic panel with GFR   Brain natriuretic peptide   Dizziness   Has since resolved after starting diuretic.  Continue hydrochlorothiazide .  Pending BNP and BMP today      Lower extremity edema   Has been responsive to hydrochlorothiazide  12.5 mg daily.  Patient still has some edema left we will continue HCTZ 12.5 mg  daily pending BNP and BMP      Relevant Orders   Basic metabolic panel with GFR   Brain natriuretic peptide    Return if symptoms worsen or fail to improve, for As scheduled .    Adina Crandall, NP

## 2024-04-24 NOTE — Patient Instructions (Addendum)
 Nice to see you today  I wrote 90 days of the hydrochlorothiazide , so check with the pharmacy  Follow up with me as scheduled in October

## 2024-04-24 NOTE — Assessment & Plan Note (Signed)
 Blood pressure controlled today patient maintained on HCTZ 12.5 mg daily.  Patient also lost some weight.  Continue with the healthy lifestyle applications continue taking hydrochlorothiazide  as prescribed

## 2024-04-25 ENCOUNTER — Ambulatory Visit: Payer: Self-pay | Admitting: Family

## 2024-04-25 LAB — BASIC METABOLIC PANEL WITH GFR
BUN: 13 mg/dL (ref 6–23)
CO2: 28 meq/L (ref 19–32)
Calcium: 9.2 mg/dL (ref 8.4–10.5)
Chloride: 103 meq/L (ref 96–112)
Creatinine, Ser: 0.99 mg/dL (ref 0.40–1.50)
GFR: 80.02 mL/min (ref >60.00)
Glucose, Bld: 85 mg/dL (ref 70–99)
Potassium: 4.7 meq/L (ref 3.5–5.1)
Sodium: 139 meq/L (ref 135–145)

## 2024-04-25 LAB — BRAIN NATRIURETIC PEPTIDE: Pro B Natriuretic peptide (BNP): 72 pg/mL (ref 0.0–100.0)

## 2024-04-30 DIAGNOSIS — I480 Paroxysmal atrial fibrillation: Secondary | ICD-10-CM | POA: Diagnosis not present

## 2024-05-28 ENCOUNTER — Other Ambulatory Visit: Payer: Self-pay | Admitting: Nurse Practitioner

## 2024-05-28 DIAGNOSIS — R7989 Other specified abnormal findings of blood chemistry: Secondary | ICD-10-CM

## 2024-05-28 DIAGNOSIS — R030 Elevated blood-pressure reading, without diagnosis of hypertension: Secondary | ICD-10-CM

## 2024-05-28 DIAGNOSIS — R600 Localized edema: Secondary | ICD-10-CM

## 2024-07-01 ENCOUNTER — Ambulatory Visit: Admitting: Nurse Practitioner

## 2024-07-01 VITALS — BP 124/98 | HR 58 | Temp 97.8°F | Ht 68.5 in | Wt 379.0 lb

## 2024-07-01 DIAGNOSIS — I10 Essential (primary) hypertension: Secondary | ICD-10-CM

## 2024-07-01 DIAGNOSIS — Z23 Encounter for immunization: Secondary | ICD-10-CM | POA: Diagnosis not present

## 2024-07-01 DIAGNOSIS — R0609 Other forms of dyspnea: Secondary | ICD-10-CM

## 2024-07-01 DIAGNOSIS — R600 Localized edema: Secondary | ICD-10-CM | POA: Diagnosis not present

## 2024-07-01 MED ORDER — LOSARTAN POTASSIUM 25 MG PO TABS: 25.0000 mg | ORAL_TABLET | Freq: Every day | ORAL | 1 refills | Status: DC

## 2024-07-01 NOTE — Progress Notes (Signed)
 Established Patient Office Visit  Subjective   Patient ID: Benjamin Patterson., male    DOB: 1961-03-06  Age: 63 y.o. MRN: [id number]  Chief Complaint  Patient presents with   Follow-up    Weight/BP    HPI  Discussed the use of AI scribe software for clinical note transcription with the patient, who gave verbal consent to proceed.  History of Present Illness Benjamin Patterson is a 63 year old male with hypertension who presents with elevated blood pressure and shortness of breath.  He is currently taking hydrochlorothiazide  but does not have a blood pressure monitor at home to check his readings regularly. No recent episodes of dizziness or lightheadedness, although he occasionally experiences brief 'swimmy head' sensations.  He has developed shortness of breath over the past month, first noticed during a trip in mid-September. Significant shortness of breath occurs while moving items around the house and during exercise, particularly on the elliptical machine, where he had to stop due to difficulty catching his breath. He also experienced tingling in his back during one episode. However, he did not experience these symptoms during his most recent exercise session. His shortness of breath resolves with rest and is not associated with chest pain, although he describes a sensation of heaviness. Increased breathing effort occurs with minimal exertion, such as walking to the mailbox or getting in and out of the car.  He has a history of supraventricular tachycardia (SVT) with a previous monitor showing non-sustained SVT and occasional supraventricular ectopy. He has not experienced any palpitations or heart 'flopping' sensations recently. He is not currently under the care of a cardiologist.  He mentions a past episode of atrial fibrillation in 2015 or 2016, after which he lost weight and was taken off beta blockers due to dizziness. He questions whether he is truly intolerant to beta  blockers, as he was able to tolerate them before his weight loss.  He reports a family history of congestive heart failure in both parents. No personal or family history of blood clots. He has noticed some swelling in his legs but is unsure if it is related to his current symptoms.     Review of Systems  Constitutional:  Negative for chills and fever.  Respiratory:  Positive for shortness of breath.   Cardiovascular:  Positive for leg swelling. Negative for chest pain.  Neurological:  Positive for dizziness. Negative for headaches.      Objective:     BP (!) 124/98   Pulse (!) 58   Temp 97.8 F (36.6 C) (Oral)   Ht 5' 8.5 (1.74 m)   Wt (!) 379 lb (171.9 kg)   SpO2 98%   BMI 56.79 kg/m  BP Readings from Last 3 Encounters:  07/01/24 (!) 124/98  04/24/24 132/88  03/31/24 (!) 138/92   Wt Readings from Last 3 Encounters:  07/01/24 (!) 379 lb (171.9 kg)  04/24/24 (!) 374 lb 6.4 oz (169.8 kg)  03/31/24 (!) 388 lb 6.4 oz (176.2 kg)   SpO2 Readings from Last 3 Encounters:  07/01/24 98%  04/24/24 97%  03/31/24 97%      Physical Exam Vitals and nursing note reviewed.  Constitutional:      Appearance: Normal appearance. He is obese.  Cardiovascular:     Rate and Rhythm: Normal rate and regular rhythm.     Heart sounds: Normal heart sounds.  Pulmonary:     Effort: Pulmonary effort is normal.     Breath sounds:  Normal breath sounds.  Musculoskeletal:     Right lower leg: Edema present.     Left lower leg: Edema present.  Neurological:     Mental Status: He is alert.      No results found for any visits on 07/01/24.    The 10-year ASCVD risk score (Arnett DK, et al., 2019) is: 12.4%    Assessment & Plan:   Problem List Items Addressed This Visit       Other   Morbid obesity (HCC)   Elevated BP without diagnosis of hypertension   Relevant Medications   losartan (COZAAR) 25 MG tablet   Lower extremity edema   Other Visit Diagnoses       DOE (dyspnea  on exertion)    -  Primary   Relevant Orders   Ambulatory referral to Cardiology     Need for influenza vaccination       Relevant Orders   Flu vaccine HIGH DOSE PF(Fluzone Trivalent) (Completed)     Assessment and Plan Assessment & Plan Paroxysmal supraventricular tachycardia with exertional shortness of breath and dizziness Intermittent exertional shortness of breath and dizziness possibly linked to PSVT. Holter monitor showed non-sustained SVT. Current heart rate 58 bpm complicates treatment due to history of AFib and beta blocker intolerance. Uncontrolled blood pressure and dysrhythmia risk heart failure. - Refer to cardiology for evaluation and potential echocardiogram and stress test. - Advise caution with physical activity; stop if symptoms occur. - Monitor for chest pain or symptoms at rest.  Primary Hypertension  Slightly elevated blood pressure managed with hydrochlorothiazide  12.5 mg daily. Potential need for medication adjustment if elevation persists. - Recheck blood pressure today. - with history of lower extremity edema avoid CCB, start on losartan 25mg  daily -encourage getting at home BP monitor - 1 month non fasting lab to check BMP  Lower extremity edema Lower extremity edema possibly related to cardiac issues or other causes.  Weight management Discussed recent weight gain.   Return in about 3 months (around 10/01/2024) for BP recheck.    Adina Crandall, NP

## 2024-07-01 NOTE — Patient Instructions (Signed)
 Nice to see you today  I have referred you to cardiology Take it easier at the gym until you are evaluated If you have chest pain or shortness of breath at rest let the office now or go to the local ED Follow up with me in 3 months. Make a lab appt for 1 month

## 2024-07-25 ENCOUNTER — Ambulatory Visit: Attending: Nurse Practitioner | Admitting: Nurse Practitioner

## 2024-07-25 ENCOUNTER — Telehealth: Payer: Self-pay | Admitting: Nurse Practitioner

## 2024-07-25 VITALS — BP 178/110 | HR 63 | Ht 68.5 in | Wt 386.4 lb

## 2024-07-25 DIAGNOSIS — I480 Paroxysmal atrial fibrillation: Secondary | ICD-10-CM | POA: Diagnosis not present

## 2024-07-25 DIAGNOSIS — I4729 Other ventricular tachycardia: Secondary | ICD-10-CM | POA: Diagnosis not present

## 2024-07-25 DIAGNOSIS — I471 Supraventricular tachycardia, unspecified: Secondary | ICD-10-CM

## 2024-07-25 DIAGNOSIS — E66813 Obesity, class 3: Secondary | ICD-10-CM

## 2024-07-25 DIAGNOSIS — R0609 Other forms of dyspnea: Secondary | ICD-10-CM | POA: Diagnosis not present

## 2024-07-25 DIAGNOSIS — Z6841 Body Mass Index (BMI) 40.0 and over, adult: Secondary | ICD-10-CM

## 2024-07-25 DIAGNOSIS — I10 Essential (primary) hypertension: Secondary | ICD-10-CM

## 2024-07-25 LAB — CBC

## 2024-07-25 MED ORDER — CARVEDILOL 3.125 MG PO TABS: 3.1250 mg | ORAL_TABLET | Freq: Two times a day (BID) | ORAL | 3 refills | Status: DC

## 2024-07-25 MED ORDER — LOSARTAN POTASSIUM 50 MG PO TABS: 50.0000 mg | ORAL_TABLET | Freq: Every day | ORAL | 3 refills | Status: DC

## 2024-07-25 NOTE — Telephone Encounter (Signed)
 Labs done at cardiologist is sufficient. We can cancel his lab appointment with me

## 2024-07-25 NOTE — Progress Notes (Signed)
 Office Visit    Patient Name: Benjamin Patterson. Date of Encounter: 07/25/2024  Primary Care Provider:  Wendee Lynwood HERO, NP Primary Cardiologist:  None  Chief Complaint    63 year old male with a history of paroxysmal atrial fibrillation, NSVT, PSVT, hypertension gout, and obesity who presents for heart first clinic new patient evaluation.   Past Medical History    Past Medical History:  Diagnosis Date   Cellulitis    required admission   Gout    Hypertension    Knee pain, bilateral    Obesity    PAF (paroxysmal atrial fibrillation) (HCC) 08/12/2011   S/P cardiac cath 08/12/2011   Normal coronaries per cath   Past Surgical History:  Procedure Laterality Date   CARDIAC CATHETERIZATION  Dec 2012   Normal.   I & D EXTREMITY Left 05/17/2017   Procedure: IRRIGATION AND DEBRIDEMENT LEFT AXILLARY ABSCESS;  Surgeon: Teresa Lonni HERO, MD;  Location: [facility] OR;  Service: General;  Laterality: Left;   LEFT HEART CATHETERIZATION WITH CORONARY ANGIOGRAM N/A 08/04/2011   Procedure: LEFT HEART CATHETERIZATION WITH CORONARY ANGIOGRAM;  Surgeon: Debby JONETTA Como, MD;  Location: [facility] CATH LAB;  Service: Cardiovascular;  Laterality: N/A;    Allergies  Allergies  Allergen Reactions   Beta Adrenergic Blockers Other (See Comments)    Dizziness & weight loss     Labs/Other Studies Reviewed    The following studies were reviewed today:  Cardiac Studies & Procedures   ______________________________________________________________________________________________        MONITORS  LONG TERM MONITOR (3-14 DAYS) 04/28/2024  Narrative HR 40 to 207, average 64. 8 nonsustained VT, longest 11 beats. 22 nonsustained SVT, longest 15 seconds Occasional supraventricular ectopy, 1.1% Rare ventricular ectopy. No sustained arrhythmias. No atrial fibrillation.  Ole T. Cindie, MD, [facility], [facility] Cardiac Electrophysiology        ______________________________________________________________________________________________     Recent Labs: 03/31/2024: ALT 13; Hemoglobin 14.0; Platelets 186.0; TSH 1.25 04/24/2024: BUN 13; Creatinine, Ser 0.99; Potassium 4.7; Pro B Natriuretic peptide (BNP) 72.0; Sodium 139  Recent Lipid Panel    Component Value Date/Time   CHOL 144 03/31/2024 1020   TRIG 109.0 03/31/2024 1020   HDL 41.60 03/31/2024 1020   CHOLHDL 3 03/31/2024 1020   VLDL 21.8 03/31/2024 1020   LDLCALC 81 03/31/2024 1020    History of Present Illness    63 year old male with the above past medical history including paroxysmal atrial fibrillation, NSVT, PSVT, hypertension, gout, and obesity.  He was previously evaluated by cardiology in 2012 in the setting of atrial fibrillation.  Cardiac catheterization at the time showed normal coronary arteries. Echocardiogram in 2012 showed EF 55 to 60%, no significant valvular abnormalities.  Cardiac monitor per PCP in 04/2024 in the setting of palpitations, dizziness with exercise showed predominantly normal sinus rhythm, 8 runs of NSVT, longest lasting 11 beats, 22 runs of PSVT, longest lasting 15 seconds, rare PACs and PVCs, no atrial fibrillation.  Additionally, his BP was elevated.  He was started on HCTZ and losartan.  He saw his PCP on 07/01/2024 and reported increased lower extremity edema, dyspnea on exertion, elevated BP by escalation of antihypertensive regimen.  He was referred to cardiology.    He presents today for heart first clinic new patient evaluation. He shares that he previously lost a significant amount of weight in 2016.  This was done with diet and exercise alone.  Unfortunately, during COVID he gained back nearly half of the weight he had lost.  He  believes this has contributed to his symptoms of elevated blood pressure, dyspnea on exertion.  He has noticed an occasionally elevated heart rate with exercise,with HR as high as 190 bpm.  He exercises  regularly, he drinks a significant amount of caffeine, at least 112 ounce pot of coffee a day, though he recently switched to half decaf coffee.  He reports a family history of heart disease, his mother had a heart attack in her early 61s, as well as a history of atrial fibrillation.  His BP has remained elevated at home.  He denies any chest pain, presyncope, syncope, PND, orthopnea, weight gain.  Home Medications    Current Outpatient Medications  Medication Sig Dispense Refill   hydrochlorothiazide  (HYDRODIURIL ) 12.5 MG tablet TAKE 1 TABLET(12.5 MG) BY MOUTH DAILY 90 tablet 1   losartan (COZAAR) 25 MG tablet Take 1 tablet (25 mg total) by mouth daily. 90 tablet 1   No current facility-administered medications for this visit.     Review of Systems    He denies chest pain, pnd, orthopnea, n, v, dizziness, syncope, weight gain, or early satiety. All other systems reviewed and are otherwise negative except as noted above.   Physical Exam    VS:  BP (!) 180/110   Pulse 63   Ht 5' 8.5 (1.74 m)   Wt (!) 386 lb 6.4 oz (175.3 kg)   SpO2 98%   BMI 57.90 kg/m  GEN: Well nourished, well developed, in no acute distress. HEENT: normal. Neck: Supple, no JVD, carotid bruits, or masses. Cardiac: RRR, no murmurs, rubs, or gallops. No clubbing, cyanosis, nonpitting bilateral lower extremity edema.  Radials/DP/PT 2+ and equal bilaterally.  Respiratory:  Respirations regular and unlabored, clear to auscultation bilaterally. GI: Soft, nontender, nondistended, BS + x 4. MS: no deformity or atrophy. Skin: warm and dry, no rash. Neuro:  Strength and sensation are intact. Psych: Normal affect.  Accessory Clinical Findings    ECG personally reviewed by me today - EKG Interpretation Date/Time:  Friday July 25 2024 08:14:26 EST Ventricular Rate:  63 PR Interval:  148 QRS Duration:  100 QT Interval:  416 QTC Calculation: 425 R Axis:   -15  Text Interpretation: Normal sinus rhythm Left  ventricular hypertrophy with repolarization abnormality ( R in aVL ) When compared with ECG of 03-Aug-2011 02:42, T wave inversion no longer evident in Inferior leads Confirmed by Daneen Perkins ([id number]) on 07/25/2024 8:43:28 AM  - no acute changes.   Lab Results  Component Value Date   WBC 5.5 03/31/2024   HGB 14.0 03/31/2024   HCT 43.4 03/31/2024   MCV 86.8 03/31/2024   PLT 186.0 03/31/2024   Lab Results  Component Value Date   CREATININE 0.99 04/24/2024   BUN 13 04/24/2024   NA 139 04/24/2024   K 4.7 04/24/2024   CL 103 04/24/2024   CO2 28 04/24/2024   Lab Results  Component Value Date   ALT 13 03/31/2024   AST 15 03/31/2024   ALKPHOS 64 03/31/2024   BILITOT 0.6 03/31/2024   Lab Results  Component Value Date   CHOL 144 03/31/2024   HDL 41.60 03/31/2024   LDLCALC 81 03/31/2024   TRIG 109.0 03/31/2024   CHOLHDL 3 03/31/2024    Lab Results  Component Value Date   HGBA1C 5.7 03/31/2024    Assessment & Plan   1. Palpitations/dizziness/NSVT/PSVT/paroxysmal atrial fibrillation/dyspnea on exertion: Prior history of atrial fibrillation with no documented recurrence.  Not on anticoagulation. Cardiac monitor per PCP in  04/2024 in the setting of palpitations, dizziness with exercise showed predominantly normal sinus rhythm, 8 runs of NSVT, longest lasting 11 beats, 22 runs of PSVT, longest lasting 15 seconds, rare PACs and PVCs, no atrial fibrillation.  He continues to note occasional palpitations, mild dyspnea on exertion, stable nonpitting bilateral lower extremity edema. Generally euvolemic, compensated on exam. CHA2DS2-VASc is 2.  He is not on anticoagulation.  We discussed Kardia mobile device, repeat 30-day event monitor for screening of recurrent atrial fibrillation, possible loop recorder, he declines any additional monitoring at this time.  We also discussed indication for anticoagulation should he have recurrent atrial fibrillation.  Will check CBC, BMET, TSH, magnesium.  Will  update echocardiogram. Previously evaluated for sleep apnea, was told he did not require CPAP.  Discussed possible repeat sleep study, he declines today.  If symptoms persist by adequate BP control, and pending labs, echocardiogram results, consider need for ischemic evaluation.  Reviewed ED precautions, vagal maneuvers.  Encouraged him to reduce his caffeine intake.  Will start carvedilol as below.  2. Hypertension:  BP elevated in office today, has been elevated at home.  Will increase losartan to 50 mg daily.  Will start carvedilol 3.125 mg twice daily.  Monitor BP and report BP consistently 130/80 mmHg.  Continue hydrochlorothiazide .  3. Obesity: Discussed possibility of GLP-1 receptor agonist, he prefers to continue his weight loss efforts through diet and exercise.  4. Disposition: Follow-up in 6 weeks with Dr. Lavona or APP.    Damien JAYSON Braver, NP 07/25/2024, 8:45 AM

## 2024-07-25 NOTE — Telephone Encounter (Signed)
 Called and spoke with patient. Relayed information and pt understood and will cancel appointment. No further questions or concerns.

## 2024-07-25 NOTE — Patient Instructions (Signed)
 Medication Instructions:  Your physician has recommended you make the following change in your medication:   INCREASE Losartan to 50 mg taking 1 daily  START Carvedilol 3.125 taking 1 twice a day  *If you need a refill on your cardiac medications before your next appointment, please call your pharmacy*  Lab Work: TODAY:  BMET, CBC, TSH, & MAG  If you have labs (blood work) drawn today and your tests are completely normal, you will receive your results only by: MyChart Message (if you have MyChart) OR A paper copy in the mail If you have any lab test that is abnormal or we need to change your treatment, we will call you to review the results.  Testing/Procedures: Your physician has requested that you have an echocardiogram. Echocardiography is a painless test that uses sound waves to create images of your heart. It provides your doctor with information about the size and shape of your heart and how well your heart's chambers and valves are working. This procedure takes approximately one hour. There are no restrictions for this procedure. Please do NOT wear cologne, perfume, aftershave, or lotions (deodorant is allowed). Please arrive 15 minutes prior to your appointment time.  Please note: We ask at that you not bring children with you during ultrasound (echo/ vascular) testing. Due to room size and safety concerns, children are not allowed in the ultrasound rooms during exams. Our front office staff cannot provide observation of children in our lobby area while testing is being conducted. An adult accompanying a patient to their appointment will only be allowed in the ultrasound room at the discretion of the ultrasound technician under special circumstances. We apologize for any inconvenience.   Follow-Up: At [facility], you and your health needs are our priority.  As part of our continuing mission to provide you with exceptional heart care, our providers are all part of one team.   This team includes your primary Cardiologist (physician) and Advanced Practice Providers or APPs (Physician Assistants and Nurse Practitioners) who all work together to provide you with the care you need, when you need it.  Your next appointment:   6 week(s)   Provider:    Lynwood Schilling, MD or Damien Braver, NP         We recommend signing up for the patient portal called MyChart.  Sign up information is provided on this After Visit Summary.  MyChart is used to connect with patients for Virtual Visits (Telemedicine).  Patients are able to view lab/test results, encounter notes, upcoming appointments, etc.  Non-urgent messages can be sent to your provider as well.   To learn more about what you can do with MyChart, go to [email or web address].   Other Instructions

## 2024-07-25 NOTE — Telephone Encounter (Signed)
 Does the pt need to keep his lab appt? It looks like you wanted a BMET repeated but he had that done at his cardiologist today. Thank you!

## 2024-07-25 NOTE — Telephone Encounter (Signed)
 Copied from CRM #[id number]. Topic: Clinical - Medical Advice >> Jul 25, 2024  1:30 PM Dedra B wrote: Reason for CRM: Pt had labs done at cardiologist visit today. He wants to know if he still needs to keep his lab appt for next week or if the labs done today will be sufficient. Pls let pt know.

## 2024-07-26 LAB — BASIC METABOLIC PANEL WITH GFR
BUN/Creatinine Ratio: 13 (ref 10–24)
BUN: 12 mg/dL (ref 8–27)
CO2: 22 mmol/L (ref 20–29)
Calcium: 9.5 mg/dL (ref 8.6–10.2)
Chloride: 102 mmol/L (ref 96–106)
Creatinine, Ser: 0.91 mg/dL (ref 0.76–1.27)
Glucose: 80 mg/dL (ref 70–99)
Potassium: 4.3 mmol/L (ref 3.5–5.2)
Sodium: 140 mmol/L (ref 134–144)
eGFR: 94 mL/min/1.73 (ref >59)

## 2024-07-26 LAB — CBC
Hematocrit: 43.4 % (ref 37.5–51.0)
Hemoglobin: 14 g/dL (ref 13.0–17.7)
MCH: 28.4 pg (ref 26.6–33.0)
MCHC: 32.3 g/dL (ref 31.5–35.7)
MCV: 88 fL (ref 79–97)
Platelets: 231 x10E3/uL (ref 150–450)
RBC: 4.93 x10E6/uL (ref 4.14–5.80)
RDW: 13.3 % (ref 11.6–15.4)
WBC: 7.1 x10E3/uL (ref 3.4–10.8)

## 2024-07-26 LAB — TSH: TSH: 1.37 u[IU]/mL (ref 0.450–4.500)

## 2024-07-26 LAB — MAGNESIUM: Magnesium: 2.1 mg/dL (ref 1.6–2.3)

## 2024-07-28 ENCOUNTER — Encounter: Payer: Self-pay | Admitting: Nurse Practitioner

## 2024-07-30 ENCOUNTER — Ambulatory Visit: Payer: Self-pay | Admitting: Nurse Practitioner

## 2024-08-01 ENCOUNTER — Other Ambulatory Visit

## 2024-08-28 ENCOUNTER — Ambulatory Visit (HOSPITAL_COMMUNITY)
Admission: RE | Admit: 2024-08-28 | Discharge: 2024-08-28 | Attending: Nurse Practitioner | Admitting: Nurse Practitioner

## 2024-08-28 DIAGNOSIS — R600 Localized edema: Secondary | ICD-10-CM

## 2024-08-28 DIAGNOSIS — I480 Paroxysmal atrial fibrillation: Secondary | ICD-10-CM | POA: Diagnosis present

## 2024-08-28 DIAGNOSIS — I10 Essential (primary) hypertension: Secondary | ICD-10-CM | POA: Insufficient documentation

## 2024-08-28 DIAGNOSIS — R0609 Other forms of dyspnea: Secondary | ICD-10-CM | POA: Diagnosis present

## 2024-08-28 LAB — ECHOCARDIOGRAM COMPLETE
AR max vel: 3.46 cm2
AV Area VTI: 3.69 cm2
AV Area mean vel: 3.52 cm2
AV Mean grad: 3 mmHg
AV Peak grad: 5.5 mmHg
Ao pk vel: 1.17 m/s
Area-P 1/2: 2.83 cm2
S' Lateral: 3.82 cm

## 2024-09-09 ENCOUNTER — Ambulatory Visit: Admitting: Nurse Practitioner

## 2024-09-28 NOTE — Progress Notes (Unsigned)
 " Cardiology Office Note:   Date:  10/02/2024  ID:  Benjamin KATHEE Aura Mickey., DOB 1961-07-24, MRN [id number] PCP: Wendee Lynwood HERO, NP  [facility] Providers Cardiologist:  Lynwood Schilling, MD {  History of Present Illness:   Benjamin Carne. is a 64 y.o. male who presents for their first evaluation with me.  He has been seen by Damien Braver NP.  She has a past history of paroxysmal atrial fibrillation and nonsustained VT and PSVT.  She had cardiac cath in 2012 when she had A-fib.  Had normal coronaries.  Echocardiography at that time was unremarkable.  She saw us  back recently for the first time in a long time and was complaining of some palpitations.  She had a monitor that demonstrated sinus rhythm.  She had occasional episodes of nonsustained VT with the longest being 11 beats.  She had some SVT with the longest being 15 seconds.  She did have an echocardiogram.  There was some mild concentric left ventricular hypertrophy.  Left ventricular ejection fraction was 60 to 65%.  The right ventricle was severely enlarged with a hyperdynamic RV.  Tricuspid valve was normal.  He said at the time he was evaluated he was having swimmy headed episodes and palpitations particularly with exercise.  He was more short of breath.  He actually had progressive dyspnea prior to his echo.  He said just walking to the mailbox he was starting to get short of breath.  He said that for the last weeks the symptoms have abated.  He has some chronic lower extremity swelling.  He is not having any new PND or orthopnea.  He had no new presyncope or syncope.  He is back to exercising in the gym.  ROS: As stated in the HPI and negative for all other systems.  Studies Reviewed:    EKG:   EKG Interpretation Date/Time:  Wednesday October 01 2024 16:32:12 EST Ventricular Rate:  56 PR Interval:  150 QRS Duration:  108 QT Interval:  448 QTC Calculation: 432 R Axis:   -6  Text Interpretation: Sinus bradycardia Left  ventricular hypertrophy with repolarization abnormality ( R in aVL ) When compared with ECG of 25-Jul-2024 08:14, No significant change was found Confirmed by Schilling Rattan ([id number]) on 10/01/2024 5:33:49 PM   Risk Assessment/Calculations:       Physical Exam:   VS:  BP (!) 154/92   Pulse (!) 56   Ht 5' 9 (1.753 m)   Wt (!) 384 lb 3.2 oz (174.3 kg)   SpO2 96%   BMI 56.74 kg/m    Wt Readings from Last 3 Encounters:  10/01/24 (!) 384 lb 3.2 oz (174.3 kg)  07/25/24 (!) 386 lb 6.4 oz (175.3 kg)  07/01/24 (!) 379 lb (171.9 kg)     GEN: Well nourished, well developed in no acute distress NECK: No JVD; No carotid bruits CARDIAC: RRR, no murmurs, rubs, gallops RESPIRATORY:  Clear to auscultation without rales, wheezing or rhonchi  ABDOMEN: Soft, non-tender, non-distended EXTREMITIES:  No edema; No deformity   ASSESSMENT AND PLAN:   Palpitations: He does have the arrhythmias as mentioned but his palpitations have for the most part resolved.  Given this I am not inclined to change therapy.  I would not think further workup necessary other than as below.  He will let me know if he has increasing symptoms in the future.  PAF: He has not had any documented paroxysms.  I did suggest that he get  an Apple watch.   Hypertension:  BP is not at target and I am going to increase his Cozaar  to 100 mg daily.    Obesity: We discussed GLP-1 agents and possible referral but he wants to check with his insurance.  Abnormal echocardiogram: I am going to order an MRI to evaluate he is significantly enlarged RV.  Edema: Given his dyspnea, lower extremity edema, enlarged RV I am going to order bilateral lower extremity venous Dopplers  Follow up with Damien Braver.    Signed, Lynwood Schilling, MD   "

## 2024-10-01 ENCOUNTER — Ambulatory Visit: Admitting: Nurse Practitioner

## 2024-10-01 ENCOUNTER — Ambulatory Visit: Admitting: Cardiology

## 2024-10-01 ENCOUNTER — Encounter: Payer: Self-pay | Admitting: Cardiology

## 2024-10-01 VITALS — BP 154/92 | HR 56 | Ht 69.0 in | Wt 384.2 lb

## 2024-10-01 DIAGNOSIS — I517 Cardiomegaly: Secondary | ICD-10-CM

## 2024-10-01 DIAGNOSIS — I480 Paroxysmal atrial fibrillation: Secondary | ICD-10-CM | POA: Diagnosis not present

## 2024-10-01 DIAGNOSIS — M7989 Other specified soft tissue disorders: Secondary | ICD-10-CM | POA: Diagnosis not present

## 2024-10-01 DIAGNOSIS — I10 Essential (primary) hypertension: Secondary | ICD-10-CM | POA: Diagnosis not present

## 2024-10-01 DIAGNOSIS — Z01812 Encounter for preprocedural laboratory examination: Secondary | ICD-10-CM

## 2024-10-01 MED ORDER — LOSARTAN POTASSIUM 100 MG PO TABS: 100.0000 mg | ORAL_TABLET | Freq: Every day | ORAL | 3 refills | Status: AC

## 2024-10-01 NOTE — Patient Instructions (Signed)
 Medication Instructions:  Increase Cozaar  to 100 mg once daily *If you need a refill on your cardiac medications before your next appointment, please call your pharmacy*  Lab Work: CBC within 7 days of scheduled MRI at [facility] If you have labs (blood work) drawn today and your tests are completely normal, you will receive your results only by: MyChart Message (if you have MyChart) OR A paper copy in the mail If you have any lab test that is abnormal or we need to change your treatment, we will call you to review the results.  Testing/Procedures: Cardiac MRI  Lower Extremity Doppler  Follow-Up: At [facility], you and your health needs are our priority.  As part of our continuing mission to provide you with exceptional heart care, our providers are all part of one team.  This team includes your primary Cardiologist (physician) and Advanced Practice Providers or APPs (Physician Assistants and Nurse Practitioners) who all work together to provide you with the care you need, when you need it.  Your next appointment:   3 month(s)  Provider:   Damien Braver, NP  We recommend signing up for the patient portal called MyChart.  Sign up information is provided on this After Visit Summary.  MyChart is used to connect with patients for Virtual Visits (Telemedicine).  Patients are able to view lab/test results, encounter notes, upcoming appointments, etc.  Non-urgent messages can be sent to your provider as well.   To learn more about what you can do with MyChart, go to [email or web address].   Other Instructions  Arrive 30-45 minutes prior to test start time. ?   [facility] [address] [place], [place] [address] [phone] Please take advantage of the free valet parking available at the [facility] and [employer] (Entrance C).  Proceed to the [facility] Radiology Department (First Floor) for check-in.    Magnetic resonance imaging (MRI) is a painless test  that produces images of the inside of the body without using Xrays.   During an MRI, strong magnets and radio waves work together in a data processing manager to form detailed images.    MRI images may provide more details about a medical condition than X-rays, CT scans, and ultrasounds can provide.   You may be given earphones to listen for instructions.   You may eat a light breakfast and take medications as ordered with the exception of furosemide, hydrochlorothiazide , or spironolactone(fluid pill, other). Please avoid stimulants for 12 hr prior to test. (Ie. Caffeine, nicotine, chocolate, or antihistamine medications)   If a contrast material will be used, an IV will be inserted into one of your veins. Contrast material will be injected into your IV. It will leave your body through your urine within a day. You may be told to drink plenty of fluids to help flush the contrast material out of your system.   You will be asked to remove all metal, including: Watch, jewelry, and other metal objects including hearing aids, hair pieces and dentures. Also wearable glucose monitoring systems (ie. Freestyle Libre and Omnipods) (Braces and fillings normally are not a problem.)   TEST WILL TAKE APPROXIMATELY 1 HOUR   PLEASE NOTIFY SCHEDULING AT LEAST 24 HOURS IN ADVANCE IF YOU ARE UNABLE TO KEEP YOUR APPOINTMENT. [phone]   For more information and frequently asked questions, please visit our website : [email or web address]   Please call Camie Shutter, cardiac imaging nurse navigator with any questions/concerns. Camie Shutter RN Navigator Cardiac Imaging Chantal Requena RN  Navigator Cardiac Imaging [facility] [phone] Office

## 2024-10-02 ENCOUNTER — Ambulatory Visit (HOSPITAL_COMMUNITY)
Admission: RE | Admit: 2024-10-02 | Discharge: 2024-10-02 | Disposition: A | Discharge status: Home / Self Care | Source: Ambulatory Visit | Attending: Cardiology | Admitting: Cardiology

## 2024-10-02 ENCOUNTER — Encounter: Payer: Self-pay | Admitting: Cardiology

## 2024-10-02 DIAGNOSIS — M7989 Other specified soft tissue disorders: Secondary | ICD-10-CM | POA: Diagnosis not present

## 2024-10-02 LAB — CBC
Hematocrit: 41.7 % (ref 37.5–51.0)
Hemoglobin: 13.8 g/dL (ref 13.0–17.7)
MCH: 28.8 pg (ref 26.6–33.0)
MCHC: 33.1 g/dL (ref 31.5–35.7)
MCV: 87 fL (ref 79–97)
Platelets: 213 x10E3/uL (ref 150–450)
RBC: 4.8 x10E6/uL (ref 4.14–5.80)
RDW: 13.3 % (ref 11.6–15.4)
WBC: 6.9 x10E3/uL (ref 3.4–10.8)

## 2024-10-03 ENCOUNTER — Ambulatory Visit: Payer: Self-pay | Admitting: Cardiology

## 2024-10-06 ENCOUNTER — Encounter (HOSPITAL_COMMUNITY): Payer: Self-pay

## 2024-10-07 ENCOUNTER — Telehealth: Payer: Self-pay

## 2024-10-07 ENCOUNTER — Other Ambulatory Visit: Payer: Self-pay | Admitting: Cardiology

## 2024-10-07 ENCOUNTER — Other Ambulatory Visit: Payer: Self-pay

## 2024-10-07 ENCOUNTER — Ambulatory Visit (HOSPITAL_COMMUNITY)
Admission: RE | Admit: 2024-10-07 | Discharge: 2024-10-07 | Disposition: A | Discharge status: Home / Self Care | Source: Ambulatory Visit | Attending: Cardiology | Admitting: Cardiology

## 2024-10-07 DIAGNOSIS — I517 Cardiomegaly: Secondary | ICD-10-CM

## 2024-10-07 MED ORDER — GADOBUTROL 1 MMOL/ML IV SOLN
10.0000 mL | Freq: Once | INTRAVENOUS | Status: AC | PRN
  Administered 2024-10-07: 10 mL via INTRAVENOUS

## 2024-10-07 NOTE — Telephone Encounter (Signed)
 The patient has been notified of the result and verbalized understanding.  All questions (if any) were answered. Aleck LOISE Bill, RN 10/07/2024 3:18 PM  Results sent to PCP

## 2024-10-07 NOTE — Telephone Encounter (Signed)
-----   Message from Lynwood Schilling, MD sent at 10/05/2024  1:27 PM EST ----- No obvious evidence of DVT. ----- Message ----- From: Matthews Stoney SAUNDERS, RDMS Sent: 10/02/2024   3:58 PM EST To: Lynwood Schilling, MD  Preliminary DVT report for patient Benjamin Patterson DOB 01/30/1961.

## 2024-10-08 ENCOUNTER — Other Ambulatory Visit: Payer: Self-pay

## 2024-10-08 DIAGNOSIS — R030 Elevated blood-pressure reading, without diagnosis of hypertension: Secondary | ICD-10-CM

## 2024-10-08 DIAGNOSIS — R7989 Other specified abnormal findings of blood chemistry: Secondary | ICD-10-CM

## 2024-10-08 DIAGNOSIS — R600 Localized edema: Secondary | ICD-10-CM

## 2024-10-08 MED ORDER — HYDROCHLOROTHIAZIDE 12.5 MG PO TABS: 12.5000 mg | ORAL_TABLET | Freq: Every day | ORAL | 3 refills | Status: AC

## 2024-10-08 NOTE — Telephone Encounter (Signed)
 Received faxed refill request.  Prescription pended for your review and approval.

## 2024-10-09 ENCOUNTER — Other Ambulatory Visit: Payer: Self-pay | Admitting: Nurse Practitioner

## 2024-10-09 MED ORDER — CARVEDILOL 3.125 MG PO TABS: 3.1250 mg | ORAL_TABLET | Freq: Two times a day (BID) | ORAL | 3 refills | Status: AC

## 2024-10-22 ENCOUNTER — Ambulatory Visit: Admitting: Nurse Practitioner
